# Patient Record
Sex: Male | Born: 1950 | Race: White | Hispanic: Refuse to answer
Health system: Western US, Academic
[De-identification: ages and names within clinical notes are randomized; demographics above are authoritative.]

## PROBLEM LIST (undated history)

## (undated) DIAGNOSIS — Z87438 Personal history of other diseases of male genital organs: Secondary | ICD-10-CM

## (undated) DIAGNOSIS — E119 Type 2 diabetes mellitus without complications: Secondary | ICD-10-CM

## (undated) DIAGNOSIS — N400 Enlarged prostate without lower urinary tract symptoms: Secondary | ICD-10-CM

## (undated) DIAGNOSIS — I10 Essential (primary) hypertension: Secondary | ICD-10-CM

## (undated) DIAGNOSIS — K922 Gastrointestinal hemorrhage, unspecified: Secondary | ICD-10-CM

## (undated) DIAGNOSIS — K5790 Diverticulosis of intestine, part unspecified, without perforation or abscess without bleeding: Secondary | ICD-10-CM

## (undated) HISTORY — PX: SURGICAL HX OTHER: 99

## (undated) HISTORY — DX: Personal history of other diseases of male genital organs: Z87.438

## (undated) HISTORY — DX: Diverticulosis of intestine, part unspecified, without perforation or abscess without bleeding: K57.90

## (undated) HISTORY — DX: Gastrointestinal hemorrhage, unspecified: K92.2

## (undated) HISTORY — DX: Type 2 diabetes mellitus without complications: E11.9

## (undated) HISTORY — DX: Benign prostatic hyperplasia without lower urinary tract symptoms: N40.0

## (undated) HISTORY — DX: Essential (primary) hypertension: I10

## (undated) MED ORDER — GLIMEPIRIDE 2 MG OR TABS
ORAL_TABLET | ORAL | 0 refills | Status: AC
Start: 2017-03-24 — End: ?

## (undated) MED ORDER — BENAZEPRIL HCL 5 MG OR TABS
ORAL_TABLET | ORAL | Status: AC
Start: 2016-03-13 — End: ?

## (undated) MED ORDER — GLIMEPIRIDE 2 MG OR TABS
ORAL_TABLET | ORAL | Status: AC
Start: 2014-11-06 — End: ?

## (undated) MED ORDER — METFORMIN HCL 850 MG OR TABS
ORAL_TABLET | ORAL | 0 refills | Status: AC
Start: 2019-11-19 — End: ?

---

## 2013-06-23 ENCOUNTER — Telehealth (INDEPENDENT_AMBULATORY_CARE_PROVIDER_SITE_OTHER): Payer: Self-pay

## 2013-06-23 DIAGNOSIS — N400 Enlarged prostate without lower urinary tract symptoms: Secondary | ICD-10-CM

## 2013-06-23 NOTE — Telephone Encounter (Signed)
Dayna--Referral done.  Tell him Dr Remonia Richter.  In your folder.

## 2013-06-23 NOTE — Telephone Encounter (Signed)
Pt has a New Pt appt with you on 7/27 (first available) but he was in [facility] and then back again a week later for a GI bleed and a lot of back/lwr quadrant pain at the end of May.  He really would like to get in sooner with you to review the cat scan that was done and to see what he needs to do from here.  His phone is [phone].  Please advise.  Thanks, md

## 2013-06-23 NOTE — Telephone Encounter (Signed)
I spoke with the patient and informed him I will be going out of town for several weeks.   He specifically tells me the nature of the problem.  He has an x-ray that states there is a prostate problem on the CT scan he has already been seen by a gastroenterologist in relationship to acute diverticulitis and a GI bleed.  He states as far as he is concerned those problems are currently stable he is fine was seeing me when I return which will be the week of  July 7.  I told him I would be happy to refer him to a urologist re: the prostate.   He will check with his insurance to see if doctors Samule OhmDowney  and/or Roddy are participants.   If not he will research and get me some names of urologists who do participate with this plan.

## 2013-06-23 NOTE — Telephone Encounter (Signed)
[facility] [phone] ok to leave msg.  Request referral to urologist, whichever Dr Lorenso Courier chooses, these are in his plan.   Dr Raquel James, MD  Dr Fabian Sharp, MD [facility]    [facility]  [phone] [address] [place], [place] [address]  Phone [phone]  Fax [phone]

## 2013-06-23 NOTE — Telephone Encounter (Signed)
New pt needs sooner appt

## 2013-06-23 NOTE — Telephone Encounter (Signed)
Faxed referral mailed a copy to pt.

## 2013-06-23 NOTE — Telephone Encounter (Signed)
Referral to urology

## 2013-06-29 ENCOUNTER — Telehealth (INDEPENDENT_AMBULATORY_CARE_PROVIDER_SITE_OTHER): Payer: Self-pay

## 2013-06-29 DIAGNOSIS — N429 Disorder of prostate, unspecified: Secondary | ICD-10-CM

## 2013-06-29 NOTE — Telephone Encounter (Signed)
CONFIRMED PHONE NUMBER: [phone]  CALLERS FIRST AND LAST NAME: Floyde ParkinsAndrew W Catapano  FACILITY NAME: n/a TITLE: n/a  CALLERS RELATIONSHIP:Self  RETURN CALL: Detailed message on voicemail only     SUBJECT: Referral Request Referral  PCP: Hagedorn, Debe CoderJoel Steven  [facility] Provider who should or did order the referral. Dr. Lorenso CourierHagedorn  Reason for the call: Other Clinic does not accept his insurance  Have you previously requested a referral? YES  Referral to what specialty?  [facility]   Symptom(s) for the referral: Enlarged prostate  Name of Doctor and/or Clinic:  Dr. Emeline [facility]lingGore / [facility] - patient is scheduled at 07/05/13 at 10:30  Specialty Clinic Phone number: [phone]  Specialty Clinic Fax number: [phone]  Have we already made referral? YES

## 2013-06-29 NOTE — Telephone Encounter (Signed)
He is saying they don't take his insurance

## 2013-06-29 NOTE — Telephone Encounter (Signed)
Referral for enlarged prostate

## 2013-06-29 NOTE — Telephone Encounter (Signed)
Please call the office of the urologist to verify that they take his insurance since he previously gave me the name of dr grier.

## 2013-06-29 NOTE — Telephone Encounter (Signed)
Dayna--we did a referral to dr Remonia RichterGrier.  I placed it in your folder to fax and let the patient know.  I think we did this already, but lets do this now.  Thanks

## 2013-06-30 NOTE — Telephone Encounter (Signed)
Referral completed

## 2013-06-30 NOTE — Telephone Encounter (Signed)
[phone]

## 2013-06-30 NOTE — Telephone Encounter (Signed)
Spoke with Dr. Ellyn HackGore's billing dept and she confirmed yes they do accept his insurance.

## 2013-06-30 NOTE — Telephone Encounter (Signed)
He spoke to both insurance and they said because [facility]. The doctor's office also contracted with his insurance.

## 2013-06-30 NOTE — Addendum Note (Signed)
Addended by: Perrin MalteseHAGEDORN, Hilman Kissling STEVEN on: 06/30/2013 12:36 PM     Modules accepted: Orders

## 2013-07-05 ENCOUNTER — Encounter (HOSPITAL_BASED_OUTPATIENT_CLINIC_OR_DEPARTMENT_OTHER): Payer: Self-pay | Admitting: Urology

## 2013-07-05 ENCOUNTER — Ambulatory Visit: Payer: 59 | Attending: Urology | Admitting: Urology

## 2013-07-05 VITALS — BP 110/71 | HR 85 | Ht 72.0 in | Wt 225.4 lb

## 2013-07-05 DIAGNOSIS — N529 Male erectile dysfunction, unspecified: Secondary | ICD-10-CM | POA: Insufficient documentation

## 2013-07-05 DIAGNOSIS — N401 Enlarged prostate with lower urinary tract symptoms: Secondary | ICD-10-CM | POA: Insufficient documentation

## 2013-07-05 DIAGNOSIS — R3129 Other microscopic hematuria: Secondary | ICD-10-CM | POA: Insufficient documentation

## 2013-07-05 DIAGNOSIS — N138 Other obstructive and reflux uropathy: Secondary | ICD-10-CM

## 2013-07-05 LAB — URINALYSIS COMPLETE, URN
Bacteria, URN: NONE SEEN
Bilirubin (Qual), URN: NEGATIVE
Epith Cells_Renal/Trans,URN: NEGATIVE /HPF
Epith Cells_Squamous, URN: NEGATIVE /LPF
Glucose Qual, URN: >1000 mg/dL — AB
Ketones, URN: NEGATIVE mg/dL
Leukocyte Esterase, URN: NEGATIVE
Nitrite, URN: NEGATIVE
Occult Blood, URN: NEGATIVE
Protein (Alb Semiquant), URN: NEGATIVE mg/dL
RBC, URN: NEGATIVE /HPF
Specific Gravity, URN: 1.025 g/mL (ref 1.002–1.027)
WBC, URN: NEGATIVE /HPF

## 2013-07-05 LAB — PR MEAS POST-VOIDING RESIDUAL URINE&/BLADDER CAP: Volume, URN: 70

## 2013-07-05 MED ORDER — TAMSULOSIN HCL 0.4 MG OR CAPS
0.4000 mg | ORAL_CAPSULE | Freq: Every evening | ORAL | Status: DC
Start: 2013-07-05 — End: 2013-07-20

## 2013-07-05 MED ORDER — SILDENAFIL CITRATE 20 MG OR TABS
20.0000 mg | ORAL_TABLET | Freq: Every day | ORAL | Status: DC | PRN
Start: 2013-07-05 — End: 2013-07-20

## 2013-07-05 NOTE — Progress Notes (Signed)
UROLOGY OUTPATIENT CONSULTATION:       CHIEF COMPLAINT/REASON FOR REFERRAL  Enlarged Prostate    HISTORY OF PRESENT ILLNESS  William Bryant is a 63 year old male with a past medical history of diverticulosis and type 2 diabetes who presents today for evaluation of an enlarged prostate. Earlier this month he was admitted to an outside hospital for a lower GI bleed and management of diverticulosis. After discharge, William Bryant began experiencing significant lower back pain and returned to the hospital for further evaluation, at which time a CT was obtained. The report mentioned an enlarged prostate. William Bryant also explains that he has experienced lower urinary tract symptoms over the past two years. Upon questioning the patient about his urinary symptoms, he endorses: no nocturia, no perceived increased urinary frequency, no urinary urgency, no urinary urgency with a fear of leaking, incomplete emptying, weak stream, post-void dribbling, no intermittency, no hesitancy, no split urinary stream, and occasional need for abdominal strain.    He reports no associated incontinence. He has not tried prior treatments for these symptoms. I queried William Bryant about non-traditional therapies for his symptoms and he states that he has not tried non-traditional medications. He reports social alcohol consumption including 1-2 drinks per week, history of tobacco use numbering 20 pack years, having quit in 2000 and regular caffeine consumption although he has switched to tea more recently. He has no history of urinary tract infections, no dysuria, no history of sexually transmitted infections, no hematuria, no comorbid neurologic conditions, and no associated pain symptoms. He has no other notable other genitourinary history. He has no history of bladder or urethral trauma. He has family history of urinary symptoms including positive history among first-degree relatives (his father had prostate problems although he  is unsure whether this was cancer or an enlarged prostate). William Bryant has had multiple DREs in the past which he reports were normal. His most recent PSA obtained during his recent hospitalization (June 14, 2013) was 1.92. He has never had a prostate biopsy. He is currently interested in medical management and would like to avoid surgical management. William Bryant also reports recent difficulty with erections that began within the past year. He is able to achieve satisfactory erections ~90% of the time, however he has difficulty maintaining them during sexual activity. He is concerned this could be related to his prostate as well as his diabetes.     Past Medical Hx:    Diverticulosis  DM Type 2    Past Surgical Hx:    Knee replacement X 2    Family and Social Hx:    Father: colon cancer  Mother: Heart disesase  Sister: Breast cancer    Active Meds:    Metformin 1000 BID  Simvastatin 20mg   Benzepril 5mg   Aspirin 81mg     Allergies:    None    Review of Systems:   A 14-system review of systems was filled out by the patient and reviewed by me and can be linked to in the chart dated today.    OBJECTIVE:  Physical Exam  Vitals:    Filed Vitals:    07/05/13 1026 07/05/13 1040   BP:  110/71   Pulse:  85   Height: 6' (1.829 m)    Weight: 225 lb 6.4 oz (102.241 kg)    SpO2:  96%   General: William Bryant sappears comfortable and in no acute distress.  HEENT:  WNL  Lymphatic:  No neck adenopathy, no groin adenopathy,  no axilla adenopathy.  Respiratory:  Normal effort, bilateral symmetric chest expansion, no wheezes.  Cardiovascular:  Palpable pulses in the upper extremities that are regular.  Abdomen: No abdominal masses or tenderness, no hepatosplenomegaly  Back: No CVA tenderness and no flank mass.  Genitourinary: Uncircumcised penis with no lesions noticed. The penis is not buried. Orthotopic meatus with no stenosis. No scrotal wall skin changes. Testes descended bilaterally, no scrotal rash or lesions noticed,  epidimymis normal to palpation, no hernia.    Rectal: Perineum normal to visual inspection, no erythema or irritation, sphincter with good tone, prostate smooth, symmetric, ++ enlargement with no nodule.   Extremities:  No clubbing or cyanosis.  Neuro/Psych:  Alert and Oriented x3, affect appropriate.    Labs, medical images, tracings, or specimens reviewed today with the patient include:  CT (06/14/13): "Enlarged prostate with possible TURP defect. Slightly thickwalled bladder presumably hypertrophy"  PSA (06/14/13): 1.92    Other Procedures:  To evaluate Marda Stalkerndrew W Chachere's complaint of the symptom of weak urinary stream, we performed post-void residual assessment by bladder scan.   Post void Residual: 70 cc   Impression:  Mild to moderate incomplete emptying.     ASSESSMENT:   William Bryant is a 63 year old male who presents with lower urinary tract symptoms and recent imaging suggestive of BPH. He is interested in Bryant analystmedical management.    PLAN:  1. BPH  Mr. Linford ArnoldRabinovich's age, LUTS, and recent imaging are suggestive of BPH. An emptying study was performed in the office which showed a post void residual of 70 mL. A urinalysis was also performed to evaluate for hematuria which William Bryant reported having during his hospitalization. Given his interest in medical management as well as the impact of his symptoms on his quality of life, I discussed starting him on Flomax (0.4mg ). I explained that this will help relax the urinary outflow tract as it goes through the prostate, thus making it easier to urinate. We also discussed the importance of avoiding bladder irritants such as coffee, tea, and soda.   2. Erectile Dysfunction   William Bryant reports a relatively sudden onset of erectile dysfunction over the past year. Though he does not have difficulty achieving erections, he does have difficulty maintaining them throughout sexual activity. We had a discussion regarding the possible cause of erectile dysfunction,  including psychological and organic. I went over two options, including Viagra and Cialis, including their benefits and side effects. I am writing him a prescription for the 20 mg dose of sildenafil.     William Bryant will return to the clinic in 4 months to reevaluate his lower urinary tract symptoms as well as his erectile dysfunction.

## 2013-07-19 ENCOUNTER — Encounter (INDEPENDENT_AMBULATORY_CARE_PROVIDER_SITE_OTHER): Payer: Self-pay

## 2013-07-20 ENCOUNTER — Encounter (INDEPENDENT_AMBULATORY_CARE_PROVIDER_SITE_OTHER): Payer: Self-pay

## 2013-07-20 ENCOUNTER — Ambulatory Visit (INDEPENDENT_AMBULATORY_CARE_PROVIDER_SITE_OTHER): Payer: 59

## 2013-07-20 VITALS — BP 120/60 | HR 86 | Ht 70.0 in | Wt 222.0 lb

## 2013-07-20 DIAGNOSIS — E782 Mixed hyperlipidemia: Secondary | ICD-10-CM

## 2013-07-20 DIAGNOSIS — K5731 Diverticulosis of large intestine without perforation or abscess with bleeding: Secondary | ICD-10-CM

## 2013-07-20 DIAGNOSIS — E119 Type 2 diabetes mellitus without complications: Secondary | ICD-10-CM

## 2013-07-20 DIAGNOSIS — N138 Other obstructive and reflux uropathy: Secondary | ICD-10-CM | POA: Insufficient documentation

## 2013-07-20 DIAGNOSIS — N401 Enlarged prostate with lower urinary tract symptoms: Secondary | ICD-10-CM | POA: Insufficient documentation

## 2013-07-20 DIAGNOSIS — K922 Gastrointestinal hemorrhage, unspecified: Secondary | ICD-10-CM | POA: Insufficient documentation

## 2013-07-20 DIAGNOSIS — N400 Enlarged prostate without lower urinary tract symptoms: Secondary | ICD-10-CM

## 2013-07-20 DIAGNOSIS — N529 Male erectile dysfunction, unspecified: Secondary | ICD-10-CM | POA: Insufficient documentation

## 2013-07-20 NOTE — Patient Instructions (Addendum)
Seek immediate attention if symptoms are worsening.    Call if symptoms are not resolving with therapy.    Please schedule a general wellness exam for September.

## 2013-07-20 NOTE — Progress Notes (Signed)
DATE: 07/20/2013     PHYSICIAN NOTE:  Patient Name: William Bryant  Medical Record Number: [id number]  Visit Date/time: 07/20/2013     Date of Birth: [birth year]    CHIEF COMPLAINT:    William Bryant is a 63 year old male who is here today for review of multiple medical problems, and who is here to establish care.     PROBLEMS TO BE ADDRESSED ON TODAYS VISIT:  (250.00) Type 2 diabetes mellitus without complication  (primary encounter diagnosis)  (578.9) Lower GI bleed  (562.12) Diverticulosis of large intestine with hemorrhage  (272.2) Mixed hyperlipidemia  (600.00) BPH (benign prostatic hyperplasia)  (841.32(607.84) Erectile dysfunction, unspecified erectile dysfunction type    HPI:  History provided by patient.    William Bryant is a 63 year old male who is here today for review of multiple medical problems, and who is here to establish care. The patient was recently admitted to a hospital on 06/03/13 for a lower GI bleed secondary to diverticulosis.     He has a h/o diabetes mellitus type II, and was diagnosed with this about two years ago. The patient is currently on metformin 1,000 mg bid.     The patient was evaluated by Dr. Emeline DarlingGore of urology on 07/05/13, where he was diagnosed with BPH and erectile dysfunction. Flomax and Cialis were issued at the time, however the patient has not begun these medication as he is not certain if he wants to take these medications. He was seen for microhematuria as well, however his second UA resulted in no microhematuria. A PSA was performed on 06/14/13, and was WNL at 1.92      The patient states that he does not need any lab work performed today, as he is not due for a general wellness exam until September of this year. He just had blood work performed by his prior PCP in March, as well as extensive labs just done during his recent hospitalization.and will obtain these records so that I can review them.     His mother passed away at age 10563 due to uncertain cause. His father passed  away at age 61, and the patient believes that he had colon cancer. Sister had breast cancer.     Last ophthalmology visit was a couple of months ago. He just had a colonoscopy, and was cleared for five years. TDAP is UTD.      PROBLEM LIST:  Patient Active Problem List   Diagnosis    Type 2 diabetes mellitus without complication    Lower GI bleed    Diverticulosis of large intestine with hemorrhage    Mixed hyperlipidemia    BPH (benign prostatic hyperplasia)    Erectile dysfunction       SURGICAL HISTORY:  Past Surgical History   Procedure Laterality Date    Knee replacement x2         MEDICATIONS:  Current Outpatient Prescriptions   Medication Sig Dispense Refill    Aspirin 81 MG Oral Chew Tab Take 81 mg by mouth every day.      Benazepril HCl 5 MG Oral Tab Take 1 Tab by mouth daily at bedtime. To prevent kidney damage.      Cholecalciferol (D 2000) 2000 UNITS Oral Tab Take 1 Tab by mouth every day.      MetFORMIN HCl (GLUCOPHAGE) 1000 MG Oral Tab Take 1 Tab by mouth twice a day. With largest meals      Simvastatin 20 MG Oral  Tab Take 1 Tab by mouth every day.       No current facility-administered medications for this visit.       ALLERGIES:  Review of patient's allergies indicates:  No Known Allergies    FAMILY HISTORY:  Family History   Problem Relation Age of Onset    Cancer Father      patient believes colon cancer    Breast Cancer Sister     Deep Vein Thrombosis Father        SOCIAL HISTORY:  History     Social History    Marital Status: N/A     Spouse Name: N/A     Number of Children: N/A    Years of Education: N/A     Occupational History    Not on file.     Social History Main Topics    Smoking status: Former Smoker -- 22 years     Types: Cigarettes     Quit date: 02/13/1998    Smokeless tobacco: Never Used    Alcohol Use: 1.0 oz/week     1 Glasses of wine, 1 Cans of beer per week    Drug Use: No    Sexual Activity: Not on file     Other Topics Concern    Not on file     Social  History Narrative    Married with one son. Medical and legal interpreter, self employed. Rare ETOH. Former smoker. No social drug use. Rare exercise.          REVIEW OF SYSTEMS:     male    CONSTITUTIONAL: Denies, fatigue, unexpected weight loss, unexpected weight gain  NEUROLOGIC: Denies, headaches and tremors  OPHTHALMIC: Denies, blurry vision, diplopia, eye pain  ENT: Denies, hearing difficulties, nasal congestion and any ear nose or throat problems  RESPIRATORY: Denies, dyspnea on exertion, dyspnea at rest, cough, wheezing  CARDIOVASCULAR: Denies, chest pain or pressure at rest or during exercise, palpitations  GI: H/o diverticulosis of large intestine with hemorrhage, lower GI bleed   GENITOURINARY: H/o BPH, Erectile dysfunction   INTEGUMENTARY: Denies, skin rash, easy bruising, hair loss, changes in moles or new moles  RHEUMATOLOGIC/MSK: Denies, joint swelling, joint stiffness, any joint or arthritic problems  ENDOCRINE: H/o mixed hyperlipidemia, diabetes mellitus    HEME/LYMPH:  Denies, Easy bruising, Lumps under arms or around neck and A  PSYCHOSOCIAL: Denies and any psychological problems  HIV RISK: Denies, multiple sexual partners, history of a sexually transmitted disease, transfusion between 1978 and 1985 and use of illicit drugs by injection    PHYSICAL EXAM:  BP 120/60   Pulse 86   Ht 5\' 10"  (1.778 m)   Wt 222 lb (100.699 kg)   BMI 31.85 kg/m2    Physical Examination--male: Vital signs as noted above.    General Appearance and Mental Status: Well Developed/Well Nourished  [ ]  Slender  [ ]  Average  [ ]  Heavy [x ] Obese  [ x] Appropriate Affect, Oriented to Person, Place, Time    Skin: Warm, dry to touch    Head: Normocephalic, atraumatic    Eyes: EOMI, PERLA    Ears: TMs clear bilaterally    Nose: Septum intact, no lesions, pink mucosa and turbinates, no discharge    Pharynx: Clear    Neck: Supple, without JVD  or goiter    Chest: Clear     CV: Heart: RRR, no murmur, no rub, no S3, S4 noted    Abdomen:  Soft, bowel sounds present,  without masses or tenderness noted, no hepatosplenomegaly noted    Rectal:  Deferred     Extrem: no edema    MSK:   normal gait and station, no clubbing, no cyanosis;  upper and lower extremities                show no defects in symmetry, crepitance, tenderness,  masses, or effusion              normal range of motion without dislocation, subluxation or laxity; normal muscle               tone and strength.    Neuro: intact       ASSESSMENT AND PLAN:  Pertinent Labs & Imaging studies reviewed. (See chart for details)  Prior EMR records reviewed in EPIC as available and clinically relevant.    1. Type 2 diabetes mellitus without complication  He is due for blood work in September and will schedule an appointment accordingly     2. Lower GI bleed  stable    3. Diverticulosis of large intestine with hemorrhage  stable    4. Mixed hyperlipidemia  He is due for blood work in September and will schedule an appointment accordingly     5. BPH (benign prostatic hyperplasia)  He is due for blood work in September and will schedule an appointment accordingly     6. Erectile dysfunction, unspecified erectile dysfunction type  Stable    He was issued both Flomax and Cialis by the urologist, however he has not begun these medications as he is uncertain if he wants to take them or not.       Seek immediate attention if symptoms are worsening.    Call if symptoms are not resolving with therapy.      HEALTH MAINTENANCE:    Colonoscopy performed in June 2015 with Dr. Jaynie Collins, cleared for five years         Ophthalmology visit- within the year of 2015        TDAP- 11/2012           Portions of this chart were written by Erroll Luna, Medical Scribe with oversight by Katy Fitch, MD 07/20/2013 3:51 PM     I, Katy Fitch, have reviewed the information recorded by the scribe for accuracy and agree with its contents.      This medical record has been electronically generated with voice recognition software.  Every attempt is made to assure accuracy.  However, it may contain errors related to this process.

## 2013-07-20 NOTE — Progress Notes (Signed)
I, Katy FitchJoel Keegan Ducey, MD interviewed and examined the patient while overseeing the documentation performed by my Medical Scribe, Erroll LunaSarah McHugh. I have reviewed and revised as necessary the scribe's note and agree with the documented findings and plan of care. Please refer to the scribe's note below for further detailed information regarding the patient encounter and exam.  07/20/2013. 8:11 PM.

## 2013-07-27 ENCOUNTER — Other Ambulatory Visit (INDEPENDENT_AMBULATORY_CARE_PROVIDER_SITE_OTHER): Payer: Self-pay

## 2013-07-27 DIAGNOSIS — E782 Mixed hyperlipidemia: Secondary | ICD-10-CM

## 2013-07-27 NOTE — Telephone Encounter (Signed)
Pharmacy isn't listed patient request [facility] [place]

## 2013-07-28 MED ORDER — SIMVASTATIN 20 MG OR TABS
20.0000 mg | ORAL_TABLET | Freq: Every day | ORAL | Status: DC
Start: 2013-07-28 — End: 2013-12-02

## 2013-07-28 NOTE — Telephone Encounter (Signed)
Refill Request    Last visit with PCP: 07/20/13  Next visit: 09/21/13  Labs: no lipids in EPIC    Last refill: not given by you in EPIC  Letter Sent: NO  Diagnosis:?

## 2013-08-04 ENCOUNTER — Encounter (INDEPENDENT_AMBULATORY_CARE_PROVIDER_SITE_OTHER): Payer: Self-pay

## 2013-09-20 ENCOUNTER — Encounter (INDEPENDENT_AMBULATORY_CARE_PROVIDER_SITE_OTHER): Payer: Self-pay

## 2013-09-21 ENCOUNTER — Ambulatory Visit (INDEPENDENT_AMBULATORY_CARE_PROVIDER_SITE_OTHER): Payer: 59

## 2013-09-21 ENCOUNTER — Encounter (INDEPENDENT_AMBULATORY_CARE_PROVIDER_SITE_OTHER): Payer: Self-pay

## 2013-09-21 VITALS — BP 114/80 | HR 76 | Ht 70.5 in | Wt 221.0 lb

## 2013-09-21 DIAGNOSIS — Z0000 Encounter for general adult medical examination without abnormal findings: Secondary | ICD-10-CM

## 2013-09-21 DIAGNOSIS — E119 Type 2 diabetes mellitus without complications: Secondary | ICD-10-CM

## 2013-09-21 DIAGNOSIS — E782 Mixed hyperlipidemia: Secondary | ICD-10-CM

## 2013-09-21 DIAGNOSIS — N529 Male erectile dysfunction, unspecified: Secondary | ICD-10-CM

## 2013-09-21 DIAGNOSIS — K5731 Diverticulosis of large intestine without perforation or abscess with bleeding: Secondary | ICD-10-CM

## 2013-09-21 DIAGNOSIS — Z8249 Family history of ischemic heart disease and other diseases of the circulatory system: Secondary | ICD-10-CM

## 2013-09-21 DIAGNOSIS — N400 Enlarged prostate without lower urinary tract symptoms: Secondary | ICD-10-CM

## 2013-09-21 LAB — CBC, DIFF
% Basophils: 0 %
% Eosinophils: 1 %
% Immature Granulocytes: 0 %
% Lymphocytes: 22 %
% Monocytes: 8 %
% Neutrophils: 69 %
Absolute Eosinophil Count: 0.06 10*3/uL (ref 0.00–0.50)
Absolute Lymphocyte Count: 1.29 10*3/uL (ref 1.00–4.80)
Basophils: 0.02 10*3/uL (ref 0.00–0.20)
Hematocrit: 50 % (ref 38–50)
Hemoglobin: 16.6 g/dL (ref 13.0–18.0)
Immature Granulocytes: 0 10*3/uL (ref 0.00–0.05)
MCH: 28.6 pg (ref 27.3–33.6)
MCHC: 33.3 g/dL (ref 32.2–36.5)
MCV: 86 fL (ref 81–98)
Monocytes: 0.47 10*3/uL (ref 0.00–0.80)
Neutrophils: 3.98 10*3/uL (ref 1.80–7.00)
Platelet Count: 271 10*3/uL (ref 150–400)
RBC: 5.81 mil/uL — ABNORMAL HIGH (ref 4.40–5.60)
RDW-CV: 13.9 % (ref 11.6–14.4)
WBC: 5.82 10*3/uL (ref 4.3–10.0)

## 2013-09-21 LAB — COMPREHENSIVE METABOLIC PANEL
ALT (GPT): 26 U/L (ref 10–48)
AST (GOT): 17 U/L (ref 9–38)
Albumin: 4.8 g/dL (ref 3.5–5.2)
Alkaline Phosphatase (Total): 67 U/L (ref 37–159)
Anion Gap: 9 (ref 4–12)
Bilirubin (Total): 1.5 mg/dL — ABNORMAL HIGH (ref 0.2–1.3)
Calcium: 9.3 mg/dL (ref 8.9–10.2)
Carbon Dioxide, Total: 27 mEq/L (ref 22–32)
Chloride: 101 mEq/L (ref 98–108)
Creatinine: 0.85 mg/dL (ref 0.51–1.18)
GFR, Calc, African American: >60 mL/min (ref >59)
GFR, Calc, European American: >60 mL/min (ref >59)
Glucose: 138 mg/dL — ABNORMAL HIGH (ref 62–125)
Potassium: 4.5 mEq/L (ref 3.6–5.2)
Protein (Total): 7.2 g/dL (ref 6.0–8.2)
Sodium: 137 mEq/L (ref 135–145)
Urea Nitrogen: 18 mg/dL (ref 8–21)

## 2013-09-21 LAB — LIPID PANEL
Cholesterol (LDL): 75 mg/dL (ref <130)
Cholesterol/HDL Ratio: 3
HDL Cholesterol: 50 mg/dL (ref >39)
Non-HDL Cholesterol: 102 mg/dL (ref 0–159)
Total Cholesterol: 152 mg/dL (ref <200)
Triglyceride: 137 mg/dL (ref <150)

## 2013-09-21 LAB — CK, CREATINE KINASE, TOTAL ACTIVITY: Creatine Kinase Total Activity: 64 U/L (ref 62–325)

## 2013-09-21 LAB — THYROID STIMULATING HORMONE: Thyroid Stimulating Hormone: 0.635 u[IU]/mL (ref 0.400–5.000)

## 2013-09-21 NOTE — Progress Notes (Signed)
I, Katy Fitch, MD interviewed and examined the patient while overseeing the documentation performed by my Medical Scribe, Erroll Luna. I have reviewed and revised as necessary the scribe's note and agree with the documented findings and plan of care. Please refer to the scribe's note below for further detailed information regarding the patient encounter and exam.  09/21/2013. 10:51 AM.

## 2013-09-21 NOTE — Patient Instructions (Addendum)
Seek immediate attention if symptoms are worsening.    Call if symptoms are not resolving with therapy.    A letter will be coming within the next two weeks reviewing the lab results.      Electrocardiogram (ECG)  The ECG (also known as EKG) is a test that records electrical signals from your heart onto a paper strip. The pattern of these signals can help tell the doctor whether your heart is normal, under stress, or experiencing electrical problems, strain, or damage.    Getting Ready  · Wear loose, comfortable clothing that allows easy access to the chest.  · Avoid using skin lotion, because this may make it difficult for the pads to stick.   · Allow enough time before your ECG to check in. You will likely need to fill out paperwork before the test.  What Happens During an ECG  · You will be asked to remove your clothing from the waist up and to put on a gown. You will then lie down on your back.  · Electrodes (small pads) are placed on your chest, shoulders, and legs. If you have excess chest hair that keeps the electrodes from making contact, small patches of hair may need to be shaved.   · The electrodes record your heart rhythm and any change in your heart’s signals that occurs during the test.  · After a few minutes of recording, the health care provider will remove the electrodes. The ECG takes about 10 minutes.  After the Test  · You can resume your normal activity.  · The results are sent to your doctor.  · Be sure to keep your follow-up appointment.  Your next appointment is:____________________  Tell Your Health Care Provider  Talk to your health care provider if you:  · Are taking any medications  · Feel any chest discomfort  · Feel as though your heart is beating rapidly or irregularly (palpitations)  · Have ever blacked out   © 2000-2015 [employer] [address], [place] [address]. All rights reserved. This information is not intended as a substitute for professional medical  care. Always follow your healthcare professional's instructions.

## 2013-09-21 NOTE — Progress Notes (Signed)
12 lead EKG performed on pt.

## 2013-09-21 NOTE — Progress Notes (Signed)
DATE: 09/21/2013     PHYSICIAN NOTE:  Patient Name: William Bryant  Medical Record Number: [id number]  Visit Date/time: 09/21/2013     Date of Birth: 1950-02-22    CHIEF COMPLAINT:     William Bryant is a 63 year old male who is here today for review of multiple medical problems, and for a wellness exam.    PROBLEMS TO BE ADDRESSED ON TODAYS VISIT:  (V70.0) Routine general medical examination at a health care facility  (primary encounter diagnosis)  (250.00) Type 2 diabetes mellitus without complication  (272.2) Mixed hyperlipidemia  (607.84) Erectile dysfunction, unspecified erectile dysfunction type  (562.12) H/O--Diverticulosis of large intestine with hemorrhage  (600.00) BPH (benign prostatic hyperplasia)  (V17.3) Family history of early CAD    HPI:  History provided by patient.    William Bryant is a 63 year old male who is here today for review of multiple medical problems, and for a wellness exam. The patient was evaluated by Dr. Emeline Darling recently, who performed a prostate examination and checked the PSA. Denies chest pain or palpitations.     Last ophthalmology visit was within the year of 2015. Colonoscopy performed in June 2015 with Dr. Jaynie Collins, cleared for five years. TDAP is UTD.      PROBLEM LIST:  Patient Active Problem List   Diagnosis    Type 2 diabetes mellitus without complication    Lower GI bleed    Diverticulosis of large intestine with hemorrhage    Mixed hyperlipidemia    BPH (benign prostatic hyperplasia)    Erectile dysfunction    Family history of early CAD--mother 31       SURGICAL HISTORY:  Past Surgical History   Procedure Laterality Date    Knee replacement x2         MEDICATIONS:  Current Outpatient Prescriptions   Medication Sig Dispense Refill    Aspirin 81 MG Oral Chew Tab Take 81 mg by mouth every day.      Benazepril HCl 5 MG Oral Tab Take 1 Tab by mouth daily at bedtime. To prevent kidney damage.      Cholecalciferol (D 2000) 2000 UNITS Oral Tab Take 1 Tab by mouth  every day.      MetFORMIN HCl (GLUCOPHAGE) 1000 MG Oral Tab Take 1 Tab by mouth twice a day. With largest meals      Simvastatin 20 MG Oral Tab Take 1 tablet (20 mg) by mouth daily. Take 1 Tab by mouth every day. 30 tablet 3     No current facility-administered medications for this visit.       ALLERGIES:  Review of patient's allergies indicates:  No Known Allergies    FAMILY HISTORY:  Family History   Problem Relation Age of Onset    Cancer Father      patient believes colon cancer    Breast Cancer Sister     Deep Vein Thrombosis Father     Myocardial Infarction       grandfather        SOCIAL HISTORY:  History     Social History    Marital Status: N/A     Spouse Name: N/A     Number of Children: N/A    Years of Education: N/A     Occupational History    Not on file.     Social History Main Topics    Smoking status: Former Smoker -- 22 years     Types: Cigarettes  Quit date: 02/13/1998    Smokeless tobacco: Never Used    Alcohol Use: 1.0 oz/week     1 Glasses of wine, 1 Cans of beer per week    Drug Use: No    Sexual Activity: Not on file     Other Topics Concern    Not on file     Social History Narrative    Married with one son. Medical and legal interpreter, self employed. Rare ETOH. Former smoker. No social drug use. Rare exercise.          REVIEW OF SYSTEMS:     Male    CONSTITUTIONAL: Denies, fatigue, unexpected weight loss, unexpected weight gain  NEUROLOGIC: Denies, headaches and tremors  OPHTHALMIC: Denies, blurry vision, diplopia, eye pain  ENT: Denies, hearing difficulties, nasal congestion and any ear nose or throat problems  RESPIRATORY: Denies, dyspnea on exertion, dyspnea at rest, cough, wheezing  CARDIOVASCULAR: Denies, chest pain or pressure at rest or during exercise, palpitations  GI: H/o diverticulosis of large intestine with hemorrhage   GENITOURINARY: H/o ED, BPH  INTEGUMENTARY: Denies, skin rash, easy bruising, hair loss, changes in moles or new moles  RHEUMATOLOGIC/MSK:  Denies, joint swelling, joint stiffness, any joint or arthritic problems  ENDOCRINE: H/o diabetes mellitus, hyperlipidemia   HEME/LYMPH:  Denies, Easy bruising, Lumps under arms or around neck and A  PSYCHOSOCIAL: Denies and any psychological problems  HIV RISK: Denies, multiple sexual partners, history of a sexually transmitted disease, transfusion between 1978 and 1985 and use of illicit drugs by injection    PHYSICAL EXAM:  BP 114/80 mmHg   Pulse 76   Ht 5' 10.5" (1.791 m)   Wt 221 lb (100.245 kg)   BMI 31.25 kg/m2    Physical Examination--Male: Vital signs as noted above.    General Appearance and Mental Status: Well Developed/Well Nourished   Slender   Average   Heavy [ x] Obese  [x ] Appropriate Affect, Oriented to Person, Place, Time    Skin: Warm, dry to touch    Head: Normocephalic, atraumatic    Eyes: EOMI, PERLA    Ears: TMs clear bilaterally    Nose: Septum intact, no lesions, pink mucosa and turbinates, no discharge    Pharynx: Clear    Neck: Supple, without JVD  or goiter    Chest: Clear     CV: Heart: RRR, no murmur, no rub, no S3, S4 noted    Abdomen: Soft, bowel sounds present, without masses or tenderness noted, no hepatosplenomegaly noted    Male GU:         Testes: nontender, no masses noted    Penis:  no lesions, no discharge    Prostate: size: deferred, Dr. Emeline Darling just examined the prostate     Rectal: deferred     Extrem: no edema    MSK:   normal gait and station, no clubbing, no cyanosis;  upper and lower extremities                show no defects in symmetry, crepitance, tenderness,  masses, or effusion              normal range of motion without dislocation, subluxation or laxity; normal muscle               tone and strength.    Neuro: intact     Diabetes Mellitus Foot exam:    - Visual inspection:  normal to inspection, with intact skin, no  significant callus formation, no evidence of ischemia   - Monofilament exam:  normal    - Pulse exam: normal     LAB RESULTS:  pending    ECG  RESULTS:    Done on 09/21/2013 at [facility]  Rate: 63 bpm, no significant abnormality      This ECG was interpreted by me contemporaneously. Obtaining the ECG had medical necessity in my clinical opinion.    ASSESSMENT AND PLAN:  Pertinent Labs & Imaging studies reviewed. (See chart for details)  Prior EMR records reviewed in EPIC as available and clinically relevant.    1. Routine general medical examination at a health care facility  Routine blood work to be performed today:   - CBC, DIFF  - COMPREHENSIVE METABOLIC PANEL  - CREATINE KINASE TOTAL ACTIVITY  - LIPID PANEL  - HEMOGLOBIN A1C, HPLC  - THYROID STIMULATING HORMONE  - ECG ROUTINE ECG W/LEAST 12 LDS W/I&R  - CREATININE CLEARANCE, URINE; Future  - CREATININE; Future  - TOTAL ALBUMIN, URINE; Future    2. Type 2 diabetes mellitus without complication  His feet are normal on examination with good pulses and normal sensation bilaterally. I will order a hemoglobin A1C to check the diabetes mellitus, and a 24 hour urine test to check the kidney function.   - HEMOGLOBIN A1C, HPLC  - FOOT EXAMINATION PERFORMED  - CREATININE CLEARANCE, URINE; Future  - CREATININE; Future  - TOTAL ALBUMIN, URINE; Future      3. Mixed hyperlipidemia  I will check his cholesterol profile today. CPK to be performed as well, as he is taking Simvastatin.   - COMPREHENSIVE METABOLIC PANEL  - CREATINE KINASE TOTAL ACTIVITY  - LIPID PANEL    4. Erectile dysfunction, unspecified erectile dysfunction type  stable    5. H/O--Diverticulosis of large intestine with hemorrhage      6. BPH (benign prostatic hyperplasia)  Stable- Dr. Emeline Darling checked a PSA recently     7. Family history of early coronary artery disease  He denies any chest pain or palpitations.   -ECG    Further treatment dependant on the results of the tests.    Seek immediate attention if symptoms are worsening.    Call if symptoms are not resolving with therapy.      HEALTH MAINTENANCE:        Colonoscopy performed in June 2015 with  Dr. Jaynie Collins, cleared for five years         Ophthalmology visit- within the year of 2015        TDAP- 11/2012           Portions of this chart were written by Erroll Luna, Medical Scribe with oversight by Katy Fitch, MD 09/21/2013 9:13 AM     I, Katy Fitch, have reviewed the information recorded by the scribe for accuracy and agree with its contents.      This medical record has been electronically generated with voice recognition software. Every attempt is made to assure accuracy.  However, it may contain errors related to this process.

## 2013-09-22 ENCOUNTER — Encounter (INDEPENDENT_AMBULATORY_CARE_PROVIDER_SITE_OTHER): Payer: Self-pay

## 2013-09-22 LAB — HEMOGLOBIN A1C, HPLC: Hemoglobin A1C: 7.5 % — ABNORMAL HIGH (ref 4.0–6.0)

## 2013-09-28 ENCOUNTER — Other Ambulatory Visit (INDEPENDENT_AMBULATORY_CARE_PROVIDER_SITE_OTHER): Payer: Self-pay

## 2013-09-28 ENCOUNTER — Telehealth (INDEPENDENT_AMBULATORY_CARE_PROVIDER_SITE_OTHER): Payer: Self-pay

## 2013-09-28 DIAGNOSIS — Z0000 Encounter for general adult medical examination without abnormal findings: Secondary | ICD-10-CM

## 2013-09-28 DIAGNOSIS — E119 Type 2 diabetes mellitus without complications: Secondary | ICD-10-CM

## 2013-09-28 LAB — ALBUMIN/CREATININE RATIO, TIMED URINE
Albumin (Micro) Interval, URN: 24.17 hr
Albumin (Micro), URN: 0.7 mg/dL
Albumin (Micro)/ 24 Hr, URN: 13 mg/24 hr (ref <30)
Albumin (Micro)/Min, URN: 9 mcg/min (ref <20)
Albumin (Micro)Total Volume, URN: 1834 mL
Albumin/Creatinine Ratio, URN: 5 mg/g Creat (ref <30)
Creatinine/Unit, URN: 136 mg/dL

## 2013-09-28 LAB — CREATININE CLEARANCE, URN
Creatinine Clearance Calc: 226 mL/min — ABNORMAL HIGH (ref 75–120)
Creatinine Interval, URN: 24.17 hr
Creatinine Total Volume, URN: 1834 mL
Creatinine/24H, Urine: 2477 mg/24 hr — ABNORMAL HIGH (ref 1000–2000)
Creatinine/Unit, Urine: 136 mg/dL

## 2013-09-28 LAB — CREATININE
Creatinine: 0.76 mg/dL (ref 0.51–1.18)
GFR, Calc, African American: >60 mL/min (ref >59)
GFR, Calc, European American: >60 mL/min (ref >59)

## 2013-09-28 MED ORDER — GLIMEPIRIDE 1 MG OR TABS
ORAL_TABLET | ORAL | Status: DC
Start: 2013-09-28 — End: 2014-04-23

## 2013-09-28 NOTE — Addendum Note (Signed)
Addended by: Consuello Bossier on: 09/28/2013 11:25 AM     Modules accepted: Orders

## 2013-09-28 NOTE — Telephone Encounter (Signed)
Please sign for A1c.

## 2013-09-28 NOTE — Addendum Note (Signed)
Addended by: Perrin Maltese on: 09/28/2013 11:31 AM     Modules accepted: Orders

## 2013-09-28 NOTE — Telephone Encounter (Signed)
Patient instructed to speak with Dayna regarding starting Glimepiride 1 mg.  Pharmacy: [facility] ([address])  [phone].

## 2013-09-28 NOTE — Telephone Encounter (Signed)
Done

## 2013-09-29 ENCOUNTER — Encounter (INDEPENDENT_AMBULATORY_CARE_PROVIDER_SITE_OTHER): Payer: Self-pay

## 2013-09-30 ENCOUNTER — Other Ambulatory Visit (INDEPENDENT_AMBULATORY_CARE_PROVIDER_SITE_OTHER): Payer: Self-pay

## 2013-09-30 DIAGNOSIS — E119 Type 2 diabetes mellitus without complications: Secondary | ICD-10-CM

## 2013-09-30 DIAGNOSIS — I10 Essential (primary) hypertension: Secondary | ICD-10-CM

## 2013-09-30 MED ORDER — BENAZEPRIL HCL 5 MG OR TABS
5.0000 mg | ORAL_TABLET | Freq: Every day | ORAL | Status: DC
Start: 2013-09-30 — End: 2013-10-28

## 2013-09-30 MED ORDER — METFORMIN HCL 1000 MG OR TABS
1000.0000 mg | ORAL_TABLET | Freq: Two times a day (BID) | ORAL | Status: DC
Start: 2013-09-30 — End: 2014-08-06

## 2013-09-30 NOTE — Telephone Encounter (Signed)
Refill Request    Last visit with PCP: 09/21/13 Physical   Next visit: none  Labs: ?    Last refill: Not given by dr. Lorenso Courier   Letter Sent: NO  Diagnosis: ?

## 2013-09-30 NOTE — Telephone Encounter (Signed)
Refill Request    Last visit with PCP: 09/21/13 physical   Next visit: none  Labs: 09/21/13 Hgb A1C Abnormal     Last refill: 09/17/12  Letter Sent: NO  Diagnosis: 250.00

## 2013-10-24 ENCOUNTER — Encounter (HOSPITAL_BASED_OUTPATIENT_CLINIC_OR_DEPARTMENT_OTHER): Payer: 59 | Admitting: Urology

## 2013-10-28 ENCOUNTER — Other Ambulatory Visit (INDEPENDENT_AMBULATORY_CARE_PROVIDER_SITE_OTHER): Payer: Self-pay

## 2013-10-28 DIAGNOSIS — I10 Essential (primary) hypertension: Secondary | ICD-10-CM

## 2013-10-28 MED ORDER — BENAZEPRIL HCL 5 MG OR TABS
5.0000 mg | ORAL_TABLET | Freq: Every day | ORAL | Status: DC
Start: 2013-10-28 — End: 2014-08-11

## 2013-10-28 NOTE — Telephone Encounter (Signed)
Refill Request    Last visit with PCP: physical: 09/21/2013  Next visit: none  Labs: cmp: 09/21/2013    Last refill: 09/30/2013  Letter Sent: NO  Diagnosis: essential hypertension, benign

## 2013-11-21 ENCOUNTER — Ambulatory Visit (INDEPENDENT_AMBULATORY_CARE_PROVIDER_SITE_OTHER): Payer: 59

## 2013-11-22 ENCOUNTER — Ambulatory Visit (INDEPENDENT_AMBULATORY_CARE_PROVIDER_SITE_OTHER): Payer: 59

## 2013-11-22 DIAGNOSIS — Z23 Encounter for immunization: Secondary | ICD-10-CM

## 2013-11-22 NOTE — Progress Notes (Signed)
VACCINE SCREENING/ORDER WHEN CONTRAINDICATIONS PRESENT    Order date: 11/22/2013  Ordering provider: Hagedorn  Clinic stock used: yes  Interpreter used?: None  Vaccine information sheet(s) discussed, patient/parent/guardian verbalized understanding? YES   Vaccines given: No orders of the defined types were placed in this encounter.     VIS given 11/22/2013 by Ballard Russellana L Buhrman, MA MA.        SCREENING QUESTIONS: INACTIVATED INFLUENZA VACCINE:  1. Do you have a high fever, severe cold-like symptoms or serious infection? NO   2. Do you have any food allergies such as eggs, chicken, chicken feathers, chicken dander, or gelatin? NO  3. Have you had a severe reaction to a vaccine in the past? NO  4. Have you had any severe reaction to a vaccine in the past such as a seizure, a convulsion from a high fever, or a paralysis problem called Guillain-Barre Syndrome? NO  5. QUESTIONS FOR WOMEN: Are you pregnant or planning on becoming pregnant in the next month? (If YES, you may give Fluarix or Fluzone Pre-Filled syringe rather than Multi-Dose Fluzone). NO    If the patient/parent/or guardian answered "yes" to any of the above questions, consult with the provider to review the responses prior to administering vaccination. Parents with a contraindication to immunization will not receive the immunization without a specific physician order to vaccinate the patient and discussion of risk and benefits related to the contraindication.     Physician Order for Administration of Vaccine(s) When Contraindications Are Present  I have reviewed the existence in this patient's health history of possible contraindication(s) to administration of vaccine. The benefits to this patient outweigh the potential risks of immunization. Administer vaccine(s): yes  Ballard RussellBuhrman, Dana L, 11/22/2013 2:29 PM MA.

## 2013-12-02 ENCOUNTER — Other Ambulatory Visit (INDEPENDENT_AMBULATORY_CARE_PROVIDER_SITE_OTHER): Payer: Self-pay

## 2013-12-02 DIAGNOSIS — E782 Mixed hyperlipidemia: Secondary | ICD-10-CM

## 2013-12-03 MED ORDER — SIMVASTATIN 20 MG OR TABS
ORAL_TABLET | ORAL | Status: DC
Start: 2013-12-03 — End: 2014-09-20

## 2013-12-03 NOTE — Telephone Encounter (Signed)
Refill Request    Last visit with PCP: 09/21/2013  Next visit: none  Labs: 09/21/2013 lipids and CMP    Last refill: 07/28/2013 #30 + 3  Letter Sent: NO    DX: Mixed hyperlipidemia

## 2014-01-02 ENCOUNTER — Other Ambulatory Visit (INDEPENDENT_AMBULATORY_CARE_PROVIDER_SITE_OTHER): Payer: Self-pay

## 2014-01-02 DIAGNOSIS — I10 Essential (primary) hypertension: Secondary | ICD-10-CM

## 2014-01-02 MED ORDER — BENAZEPRIL HCL 5 MG OR TABS
ORAL_TABLET | ORAL | Status: DC
Start: 2014-01-02 — End: 2014-02-17

## 2014-01-02 NOTE — Telephone Encounter (Signed)
Refill Request    Last visit with PCP: 09/21/13  Next visit: none  Labs: 09/28/13    Last refill: 10/28/13 # 30  Letter Sent: NO  Diagnosis: Essential hypertension, benign

## 2014-02-17 ENCOUNTER — Ambulatory Visit (INDEPENDENT_AMBULATORY_CARE_PROVIDER_SITE_OTHER): Payer: 59 | Admitting: Internal Medicine

## 2014-02-17 ENCOUNTER — Encounter (INDEPENDENT_AMBULATORY_CARE_PROVIDER_SITE_OTHER): Payer: Self-pay | Admitting: Internal Medicine

## 2014-02-17 VITALS — BP 126/70 | HR 73 | Temp 97.4°F | Ht 70.5 in | Wt 230.0 lb

## 2014-02-17 DIAGNOSIS — E119 Type 2 diabetes mellitus without complications: Secondary | ICD-10-CM

## 2014-02-17 DIAGNOSIS — J069 Acute upper respiratory infection, unspecified: Secondary | ICD-10-CM

## 2014-02-17 DIAGNOSIS — R05 Cough: Secondary | ICD-10-CM

## 2014-02-17 DIAGNOSIS — R059 Cough, unspecified: Secondary | ICD-10-CM | POA: Insufficient documentation

## 2014-02-17 DIAGNOSIS — M5412 Radiculopathy, cervical region: Secondary | ICD-10-CM

## 2014-02-17 DIAGNOSIS — R490 Dysphonia: Secondary | ICD-10-CM

## 2014-02-17 MED ORDER — GUAIFENESIN-CODEINE 100-10 MG/5ML OR SOLN
5.0000 mL | Freq: Four times a day (QID) | ORAL | Status: DC | PRN
Start: 2014-02-17 — End: 2014-12-18

## 2014-02-17 NOTE — Patient Instructions (Addendum)
Can you take plenty of liquids, pedialyte, saline nasal spray, steam, mucinex as an expectorant, and cheratussin cough medicine. Referral for PT as well. Referral to ENT- Dr Juel BurrowLin as well. Can you make a follow up apt with Dr Lorenso CourierHagedorn. Can you call Dr Lorenso CourierHagedorn or covering MD if problems.Thank you for seeing me today.Can you remember to arrive at least 15 minutes early for your next appointment.

## 2014-02-17 NOTE — Progress Notes (Signed)
Angeline SlimJohn Ishika Chesterfield, MD  Internal Medicine    PROGRESS NOTE         RE: William Bryant  MRN:  [id number]  DOB:  August 18, 1950      Date:  02/17/2014      I am seeing this patient with the following problems and last seen by Dr Lorenso CourierHagedorn 09/2013    PROBLEMS:  1. C/o getting 12/31 with stuffy nose , mucus and was hoarse and lasted 1 week and got sick again 1/30 with head congestion and cough 2/2 and worse next day and got better the next day.and yesterday felt better and today has a dry cough, no sore throat, runny nose which is clear.Nasal drainage is yellow.Has sinus congestion with post nasal drip.Was wheezing last night but less now.No earache.diking fluids, tea, vitamin C.  2. Diabetes.Stable.B.S. On meds and denies any problems.  3. Right arm numbness c/o numbness in right arm at night in the last 6 months and moves it and moves weight and improves. No weakness.No neck pain. No meds for this.Has pain in right scapula area too.    ALLERGIES - Review of patient's allergies indicates:  No Known Allergies     MEDICATIONS -   Current Outpatient Prescriptions   Medication Sig Dispense Refill   . Aspirin 81 MG Oral Chew Tab Take 81 mg by mouth every day.     . Benazepril HCl 5 MG Oral Tab Take 1 tablet (5 mg) by mouth daily. Take 1 Tab by mouth daily at bedtime. To prevent kidney damage. 30 tablet 0   . Cholecalciferol (D 2000) 2000 UNITS Oral Tab Take 1 Tab by mouth every day.     . Glimepiride 1 MG Oral Tab Take 1 tablet BID. One before breakfast and 1 before dinner. 60 tablet 2   . MetFORMIN HCl (GLUCOPHAGE) 1000 MG Oral Tab Take 1 tablet (1,000 mg) by mouth 2 times a day. Take 1 Tab by mouth twice a day. With largest meals 60 tablet 5   . Simvastatin 20 MG Oral Tab TAKE ONE TABLET BY MOUTH ONCE DAILY 90 tablet 2     No current facility-administered medications for this visit.       FH/SH as noted in the EMR.    REVIEW OF SYSTEMS:  Constitutional: No fever, fatigue, or night sweats.   Respiratory: No cough, SOB, or  wheezing.  Cardiovascular: No chest pain or palpitations.  All remaining systems have been reviewed and are negative    PHYSICAL EXAM:  BP: 126/70 mmHg  Pulse: 73  Height: 5' 10.5" (179.1 cm)  Weight: (!) 230 lb (104.327 kg)    CONSTITUTIONAL: No apparent distress. Well nourished and well developed.  HEAD / FACE: Normocephalic.  EYES: No exophthalmus, pupillary reaction is normal and EOM intact.  EARS: Hearing grossly intact.  Tympanic membranes normal.  NOSE / MOUTH / THROAT: No nasal deformity.  Mucous membranes normal.  Tongue normal throat injected but no tonsillar enlargement or exudate .  No mucosal lesions.  Sinuses-nontender  NECK / THYROID: Supple, without adenopathy.  LYMPHATIC: No palpable cervical, supraclavicular  adenopathy.   RESPIRATORY: Normal to inspection.  Lungs clear to auscultation and percussion.  CARDIOVASCULAR: Regular rhythm.  No murmurs, gallops, or rubs.  ABDOMEN: Soft, non-tender, without organomegaly or masses.  MUSCULOSKELETAL: Normal musculature. No skeletal tenderness or joint deformity.  EXTREMITIES:  Extremities appear normal.  No edema or cyanosis.  NEURO: Alert and oriented x 3.     LABORATORY DATA:  Reviewed  labs from the EMR today.    X-RAY REPORTS: Reviewed in EMR.         IMPRESSION/PLAN:  1. Upper respiratory tract infection, unspecified type.  Secondary to viral illness suspected  See AVS    2. Right cervical radiculopathy.  Suspected.  Needing further evaluation and treatment  See AVS  - REFERRAL TO PHYSICAL THERAPY    3. Cough.  Secondary to viral illness suspected  See AVS  - Guaifenesin-Codeine 100-10 MG/5ML Oral Solution; Take 5 mL by mouth every 6 hours as needed for cough.  Dispense: 120 mL; Refill: 0    4. Hoarseness.  Needing further evaluation and treatment  See AVS  - REFERRAL TO OTO-HEAD NECK SURGERY    5. Type 2 diabetes mellitus without complication.  Stable.  See AVS    Patient Instructions   Can you take plenty of liquids, pedialyte, saline nasal spray,  steam, mucinex as an expectorant, and cheratussin cough medicine. Referral for PT as well. Referral to ENT- Dr Juel Burrow as well. Can you make a follow up apt with Dr Lorenso Courier. Can you call Dr Lorenso Courier or covering MD if problems.Thank you for seeing me today.Can you remember to arrive at least 15 minutes early for your next appointment.      Clide Deutscher, MD    [facility] Internal Medicine  [address] [place], [place] [address]  [phone]

## 2014-02-17 NOTE — Progress Notes (Signed)
Does patient have 3 or more complaints:  NO  If yes, discuss with patient the need to schedule a follow up appointment due to limited appointment time.  Follow up appointment scheduled:  NO  Care Everywhere records available/ reconciled:  NO  PHQ2 complete:  YES  PHQ9 complete:  NO  Refills pended:  NO  Orders pended:  NO  Medications to discontinue:  NO    Health Maintenance updated:  YES    Health Maintenance   Topic Date Due   . HEPATITIS C SCREENING  [birth year]   . HIV SCREENING  03/29/1967   . DIABETES EYE EXAM YEARLY  03/29/1970   . ZOSTER VACCINE  03/28/2012   . DIABETES FOOT EXAM YEARLY  09/22/2014   . COLONOSCOPY Q 5 YEARS  06/14/2018   . CHOLESTEROL- MALE  09/22/2018   . TETANUS BOOSTER  11/14/2022   . INFLUENZA VACCINE  Completed

## 2014-04-23 ENCOUNTER — Other Ambulatory Visit (INDEPENDENT_AMBULATORY_CARE_PROVIDER_SITE_OTHER): Payer: Self-pay

## 2014-04-23 DIAGNOSIS — E1165 Type 2 diabetes mellitus with hyperglycemia: Secondary | ICD-10-CM

## 2014-04-23 DIAGNOSIS — IMO0002 Reserved for concepts with insufficient information to code with codable children: Secondary | ICD-10-CM

## 2014-04-24 MED ORDER — GLIMEPIRIDE 1 MG OR TABS
ORAL_TABLET | ORAL | Status: DC
Start: 2014-04-24 — End: 2014-09-20

## 2014-04-24 NOTE — Telephone Encounter (Signed)
Refill Request    Last visit with PCP: 2/16   URI     9/15  physcial  Next visit: ?  Labs: 9/15    Last refill: 9/15  Letter Sent: NO  Diagnosis:    E11.9

## 2014-04-24 NOTE — Telephone Encounter (Signed)
William Bryant--he is long overdue for a repeat A1C which is still pending since September

## 2014-08-06 ENCOUNTER — Other Ambulatory Visit (INDEPENDENT_AMBULATORY_CARE_PROVIDER_SITE_OTHER): Payer: Self-pay

## 2014-08-06 DIAGNOSIS — E118 Type 2 diabetes mellitus with unspecified complications: Secondary | ICD-10-CM

## 2014-08-07 MED ORDER — METFORMIN HCL 1000 MG OR TABS
ORAL_TABLET | ORAL | Status: DC
Start: 2014-08-07 — End: 2014-09-20

## 2014-08-07 MED ORDER — GLIMEPIRIDE 2 MG OR TABS
ORAL_TABLET | ORAL | Status: DC
Start: 2014-08-07 — End: 2014-11-06

## 2014-08-07 NOTE — Telephone Encounter (Signed)
Refill Request    Last visit with PCP: 9/15  physical  Next visit: 09/20/14  Labs: 9/15    Last refill: 4/16  Letter Sent: NO  Diagnosis:     E11.65

## 2014-08-07 NOTE — Telephone Encounter (Signed)
Refill Request    Last visit with PCP: 2/16  Glenford Peers   Physical 9/15  Next visit: 09/20/14  Labs: 9/15    Last refill: 9/15  Letter Sent: NO  Diagnosis:     E11.9

## 2014-08-11 ENCOUNTER — Other Ambulatory Visit (INDEPENDENT_AMBULATORY_CARE_PROVIDER_SITE_OTHER): Payer: Self-pay

## 2014-08-11 DIAGNOSIS — I10 Essential (primary) hypertension: Secondary | ICD-10-CM

## 2014-08-11 NOTE — Telephone Encounter (Signed)
Refill Request    Last visit with PCP: 02/17/14  Next visit: 09/20/14  Labs: 09/21/13 cmp abnormal     Last refill: 10/28/13 # 30   Letter Sent: NO  Diagnosis: Essential hypertension, benign

## 2014-08-12 MED ORDER — BENAZEPRIL HCL 5 MG OR TABS
5.0000 mg | ORAL_TABLET | Freq: Every day | ORAL | Status: DC
Start: 2014-08-12 — End: 2014-09-20

## 2014-09-08 ENCOUNTER — Encounter (INDEPENDENT_AMBULATORY_CARE_PROVIDER_SITE_OTHER): Payer: Self-pay

## 2014-09-20 ENCOUNTER — Encounter (INDEPENDENT_AMBULATORY_CARE_PROVIDER_SITE_OTHER): Payer: Self-pay

## 2014-09-20 ENCOUNTER — Ambulatory Visit (INDEPENDENT_AMBULATORY_CARE_PROVIDER_SITE_OTHER): Payer: 59

## 2014-09-20 ENCOUNTER — Ambulatory Visit: Payer: 59

## 2014-09-20 VITALS — BP 122/76 | HR 68 | Ht 70.5 in | Wt 234.0 lb

## 2014-09-20 DIAGNOSIS — I10 Essential (primary) hypertension: Secondary | ICD-10-CM

## 2014-09-20 DIAGNOSIS — Z114 Encounter for screening for human immunodeficiency virus [HIV]: Secondary | ICD-10-CM

## 2014-09-20 DIAGNOSIS — E782 Mixed hyperlipidemia: Secondary | ICD-10-CM

## 2014-09-20 DIAGNOSIS — Z0000 Encounter for general adult medical examination without abnormal findings: Secondary | ICD-10-CM | POA: Insufficient documentation

## 2014-09-20 DIAGNOSIS — E119 Type 2 diabetes mellitus without complications: Secondary | ICD-10-CM

## 2014-09-20 DIAGNOSIS — K5731 Diverticulosis of large intestine without perforation or abscess with bleeding: Secondary | ICD-10-CM

## 2014-09-20 DIAGNOSIS — N400 Enlarged prostate without lower urinary tract symptoms: Secondary | ICD-10-CM

## 2014-09-20 DIAGNOSIS — Z1159 Encounter for screening for other viral diseases: Secondary | ICD-10-CM

## 2014-09-20 DIAGNOSIS — Z8249 Family history of ischemic heart disease and other diseases of the circulatory system: Secondary | ICD-10-CM

## 2014-09-20 DIAGNOSIS — E118 Type 2 diabetes mellitus with unspecified complications: Secondary | ICD-10-CM

## 2014-09-20 LAB — THYROID STIMULATING HORMONE: Thyroid Stimulating Hormone: 0.685 u[IU]/mL (ref 0.400–5.000)

## 2014-09-20 LAB — CBC, DIFF
% Basophils: 1 %
% Eosinophils: 1 %
% Immature Granulocytes: 0 %
% Lymphocytes: 23 %
% Monocytes: 8 %
% Neutrophils: 67 %
% Nucleated RBC: 0 %
Absolute Eosinophil Count: 0.06 10*3/uL (ref 0.00–0.50)
Absolute Lymphocyte Count: 1.55 10*3/uL (ref 1.00–4.80)
Basophils: 0.04 10*3/uL (ref 0.00–0.20)
Hematocrit: 47 % (ref 38–50)
Hemoglobin: 16.1 g/dL (ref 13.0–18.0)
Immature Granulocytes: 0.03 10*3/uL (ref 0.00–0.05)
MCH: 30.1 pg (ref 27.3–33.6)
MCHC: 34 g/dL (ref 32.2–36.5)
MCV: 89 fL (ref 81–98)
Monocytes: 0.56 10*3/uL (ref 0.00–0.80)
Neutrophils: 4.46 10*3/uL (ref 1.80–7.00)
Nucleated RBC: 0 10*3/uL
Platelet Count: 248 10*3/uL (ref 150–400)
RBC: 5.35 10*6/uL (ref 4.40–5.60)
RDW-CV: 13 % (ref 11.6–14.4)
WBC: 6.7 10*3/uL (ref 4.3–10.0)

## 2014-09-20 LAB — ALBUMIN/CREATININE RATIO, RANDOM URINE
Albumin (Micro), URN: 0.7 mg/dL
Albumin/Creatinine Ratio, URN: 6 mg/g{creat} (ref <30)
Creatinine/Unit, URN: 124 mg/dL

## 2014-09-20 LAB — COMPREHENSIVE METABOLIC PANEL
ALT (GPT): 32 U/L (ref 10–48)
AST (GOT): 18 U/L (ref 9–38)
Albumin: 4.5 g/dL (ref 3.5–5.2)
Alkaline Phosphatase (Total): 58 U/L (ref 37–159)
Anion Gap: 7 (ref 4–12)
Bilirubin (Total): 1.2 mg/dL (ref 0.2–1.3)
Calcium: 9.5 mg/dL (ref 8.9–10.2)
Carbon Dioxide, Total: 28 meq/L (ref 22–32)
Chloride: 100 meq/L (ref 98–108)
Creatinine: 0.76 mg/dL (ref 0.51–1.18)
GFR, Calc, African American: >60 mL/min (ref >59)
GFR, Calc, European American: >60 mL/min (ref >59)
Glucose: 160 mg/dL — ABNORMAL HIGH (ref 62–125)
Potassium: 4.1 meq/L (ref 3.6–5.2)
Protein (Total): 6.8 g/dL (ref 6.0–8.2)
Sodium: 135 meq/L (ref 135–145)
Urea Nitrogen: 17 mg/dL (ref 8–21)

## 2014-09-20 LAB — LIPID PANEL
Cholesterol/HDL Ratio: 5.1
HDL Cholesterol: 36 mg/dL — ABNORMAL LOW (ref >39)
Non-HDL Cholesterol: 147 mg/dL (ref 0–159)
Total Cholesterol: 183 mg/dL (ref <200)
Triglyceride: 476 mg/dL — ABNORMAL HIGH (ref <150)

## 2014-09-20 LAB — PSA, DIAGNOSTIC/MONITORING: PSA, Diagnostic/Monitoring: 1.69 ng/mL (ref 0.00–4.00)

## 2014-09-20 LAB — CK, CREATINE KINASE, TOTAL ACTIVITY: Creatine Kinase Total Activity: 72 U/L (ref 62–325)

## 2014-09-20 MED ORDER — GLIMEPIRIDE 1 MG OR TABS
ORAL_TABLET | ORAL | Status: DC
Start: 2014-09-20 — End: 2014-11-07

## 2014-09-20 MED ORDER — BENAZEPRIL HCL 5 MG OR TABS
5.0000 mg | ORAL_TABLET | Freq: Every day | ORAL | Status: DC
Start: 2014-09-20 — End: 2015-07-18

## 2014-09-20 MED ORDER — METFORMIN HCL 1000 MG OR TABS
ORAL_TABLET | ORAL | Status: DC
Start: 2014-09-20 — End: 2015-07-18

## 2014-09-20 MED ORDER — SIMVASTATIN 20 MG OR TABS
20.0000 mg | ORAL_TABLET | Freq: Every day | ORAL | Status: DC
Start: 2014-09-20 — End: 2015-07-18

## 2014-09-20 NOTE — Progress Notes (Signed)
Does patient have 3 or more complaints:  NO  If yes, discuss with patient the need to schedule a follow up appointment due to limited appointment time.  Follow up appointment scheduled:  NO  Care Everywhere records available/ reconciled:  NO  PHQ2 complete:  YES  PHQ9 complete:  NO  Refills pended:  YES  Orders pended:  YES  Medications to discontinue:  NO    Health Maintenance updated:  NO    Health Maintenance   Topic Date Due   . Hepatitis C Screen  [birth year]   . HIV Screen  October 21, 1950   . Diabetes Eye Exam  03/29/1970   . Zoster Vaccine  03/28/2012   . A1c  03/22/2014   . Influenza Vaccine (1) 09/14/2014   . Diabetes Foot Exam  09/22/2014   . Colonoscopy  06/14/2018   . Cholesterol Test  09/22/2018   . Tetanus Vaccine  11/14/2022

## 2014-09-20 NOTE — Patient Instructions (Signed)
I recommend that you obtain the shingles vaccine (Zostavax) at a local pharmacy as we do not carry it here in the clinic.     Seek immediate attention if symptoms are worsening. Call if symptoms are not resolving with therapy.    A letter will be coming within the next two weeks reviewing the lab results.    Best wishes,    Dr. Hagedorn

## 2014-09-20 NOTE — Progress Notes (Signed)
I, Katy Fitch, MD interviewed and examined the patient while overseeing the documentation performed by my Medical Scribe, Valla Leaver. I have reviewed and revised as necessary the scribe's note and agree with the documented findings and plan of care. Please refer to the scribe's note below for further detailed information regarding the patient encounter and exam.  09/20/2014. 2:36 PM.

## 2014-09-20 NOTE — Progress Notes (Signed)
DATE: 09/20/2014     PHYSICIAN NOTE:  Patient Name: William Bryant  Medical Record Number: [id number]  Visit Date/time: 09/20/2014     Date of Birth: 11-06-1950    CHIEF COMPLAINT:     William Bryant is a 64 year old male who is here today for review of multiple medical problems, and for a wellness exam.    PROBLEMS TO BE ADDRESSED ON TODAYS VISIT:  (Z00.00) Routine general medical examination at a health care facility  (primary encounter diagnosis)  (E11.9) Type 2 diabetes mellitus without complication, without long-term current use of insulin (HCC)  (K57.31) Diverticulosis of large intestine with hemorrhage  (E78.2) Mixed hyperlipidemia  (N40.0) Benign non-nodular prostatic hyperplasia without lower urinary tract symptoms  (Z82.49) Family history of early CAD--mother 46  (I10) Essential hypertension, benign  (E11.8) Type 2 diabetes mellitus with complication, without long-term current use of insulin (HCC)  (Z11.59) Need for hepatitis C screening test  (Z11.4) Screening for HIV (human immunodeficiency virus)    HPI:  History provided by patient.  The patient is fasting.     William Bryant is a 64 year old male who is here today for review of multiple medical problems as reported above, and for a wellness exam. Patient is due for hepatitis C and HIV screen, and zoster vaccine. Patient intends to undergo a diabetic eye exam in the near future. He knows that he is overdue.    Patient reports he will wake up on average once nightly to urinate.    PROBLEM LIST:  Patient Active Problem List   Diagnosis   . Type 2 diabetes mellitus without complication (HCC)   . Lower GI bleed   . Diverticulosis of large intestine with hemorrhage   . Mixed hyperlipidemia   . BPH (benign prostatic hyperplasia)   . Erectile dysfunction   . Family history of early CAD--mother 31   . Right cervical radiculopathy   . Upper respiratory tract infection   . Cough   . Hoarseness       SURGICAL HISTORY:  Past Surgical History   Procedure  Laterality Date   . Knee replacement x2         MEDICATIONS:  Current Outpatient Prescriptions   Medication Sig Dispense Refill   . Aspirin 81 MG Oral Chew Tab Take 81 mg by mouth every day.     . Benazepril HCl 5 MG Oral Tab Take 1 tablet (5 mg) by mouth daily. Take 1 Tab by mouth daily at bedtime. To prevent kidney damage. 90 tablet 2   . Cholecalciferol (D 2000) 2000 UNITS Oral Tab Take 1 Tab by mouth every day.     . Glimepiride 1 MG Oral Tab TAKE ONE TABLET BY MOUTH TWICE DAILY BEFORE BREAKFAST AND DINNER. 180 tablet 2   . Glimepiride 2 MG Oral Tab TAKE ONE-HALF TABLET BY MOUTH TWICE DAILY BEFORE BREAKFAST AND  DINNER 30 tablet 1   . Guaifenesin-Codeine 100-10 MG/5ML Oral Solution Take 5 mL by mouth every 6 hours as needed for cough. 120 mL 0   . MetFORMIN HCl 1000 MG Oral Tab TAKE ONE TABLET BY MOUTH TWICE DAILY WITH LARGEST MEALS. 180 tablet 2   . Simvastatin 20 MG Oral Tab Take 1 tablet (20 mg) by mouth daily. 90 tablet 2     No current facility-administered medications for this visit.       ALLERGIES:  Review of patient's allergies indicates:  No Known Allergies    FAMILY HISTORY:  Family History   Problem Relation Age of Onset   . Cancer Father      patient believes colon cancer   . Breast Cancer Sister    . Deep Vein Thrombosis Father    . Myocardial Infarction       grandfather        SOCIAL HISTORY:  Social History     Social History   . Marital Status: N/A     Spouse Name: N/A   . Number of Children: N/A   . Years of Education: N/A     Occupational History   . Not on file.     Social History Main Topics   . Smoking status: Former Smoker -- 22 years     Types: Cigarettes     Quit date: 02/13/1998   . Smokeless tobacco: Never Used   . Alcohol Use: 1.0 oz/week     1 Glasses of wine, 1 Cans of beer per week   . Drug Use: No   . Sexual Activity: Not on file     REVIEW OF SYSTEMS:     Male    CONSTITUTIONAL: Denies, fatigue, unexpected weight loss, unexpected weight gain  NEUROLOGIC: Denies, headaches and  tremors  OPHTHALMIC: Denies, blurry vision, diplopia, eye pain  ENT: Denies, hearing difficulties, nasal congestion and any ear nose or throat problems  RESPIRATORY: Denies, dyspnea on exertion, dyspnea at rest, cough, wheezing  CARDIOVASCULAR: Denies, chest pain or pressure at rest or during exercise, palpitations  GI: H/o diverticulitis  GENITOURINARY: H/o BPH, ED  INTEGUMENTARY: Denies, skin rash, easy bruising, hair loss, changes in moles or new moles  RHEUMATOLOGIC/MSK: H/o right cervical radiculopathy  ENDOCRINE: type II DM, mixed hyperlipidemia   HEME/LYMPH:  Denies, Easy bruising, Lumps under arms or around neck and anemia  PSYCHOSOCIAL: Denies and any psychological problems      PHYSICAL EXAM:  BP 122/76 mmHg  Pulse 68  Ht 5' 10.5" (1.791 m)  Wt 234 lb (106.142 kg)  BMI 33.09 kg/m2    Physical Examination-Male: Vital signs as noted above.    General Appearance and Mental Status: Well Developed/Well Nourished   Slender   Average   Heavy [x ] Obese  [x ] Appropriate Affect, Oriented to Person, Place, Time      Skin: Warm, dry to touch    Head: Normocephalic, atraumatic    Eyes: EOMI, PERLA    Ears: TM's clear bilaterally    Nose: Septum intact, no lesions, pink mucosa and turbinates, no discharge    Pharynx: Clear    Neck: Supple, without JVD  or goiter    Chest: Clear     CV: Heart: RRR, no murmur, no rub, no S3, S4 noted    Abdomen: Soft, bowel sounds present, without masses or tenderness noted, no hepatosplenomegaly noted    Male GU:      Testes: nontender, no masses noted  Penis:  no lesions, no discharge  Prostate: size: 4+ smooth, no discrete nodules appreciated  Rectal:  no masses noted, normal sphincter tone    Extrem: no edema    MSK:   normal gait and station, no clubbing, no cyanosis;  upper and lower extremities                show no defects in symmetry, crepitance, tenderness,  masses, or effusion              normal range of motion without dislocation, subluxation or laxity; normal  muscle               tone and strength.    Neuro: intact       LAB RESULTS:  Pending.    ASSESSMENT AND PLAN:  Pertinent Labs & Imaging studies reviewed. (See chart for details)  Prior EMR records reviewed in EPIC as available and clinically relevant.    1. Routine general medical examination at a health care facility  I performed a routine general examination today and ordered routine fasting labs including TSH, PSA, and A1C for further evaluation.  - Glimepiride 1 MG Oral Tab; TAKE ONE TABLET BY MOUTH TWICE DAILY BEFORE BREAKFAST AND DINNER.  Dispense: 180 tablet; Refill: 2  - CBC, DIFF  - COMPREHENSIVE METABOLIC PANEL  - LIPID PANEL  - PROSTATE SPECIFIC ANTIGEN  - THYROID STIMULATING HORMONE  - HEMOGLOBIN A1C, HPLC  - CREATINE KINASE TOTAL ACTIVITY  - URINE SCREEN, MICROALBUMINURIA  - Hepatitis C Ab with REFLEX PCR  - HIV ANTIGEN AND ANTIBODY SCRN    2. Type 2 diabetes mellitus without complication, without long-term current use of insulin (HCC)  Patient is due for a diabetic eye exam. I referred him to Dr. Clide Deutscher for further evaluation and treatment. I refilled Metformin 1000mg  twice daily.   - REFERRAL TO EYE CARE  - MetFORMIN HCl 1000 MG Oral Tab; TAKE ONE TABLET BY MOUTH TWICE DAILY WITH LARGEST MEALS.  Dispense: 180 tablet; Refill: 2    3. Diverticulosis of large intestine with hemorrhage  Controlled.     4. Mixed hyperlipidemia  I ordered a fasting lipid panel for further evaluation and refilled Simvastatin 20mg  daily for same.   - Simvastatin 20 MG Oral Tab; Take 1 tablet (20 mg) by mouth daily.  Dispense: 90 tablet; Refill: 2    5. Benign non-nodular prostatic hyperplasia without lower urinary tract symptoms  I ordered a PSA for further evaluation.     6. Family history of early CAD--mother 63  Controlled.     7. Essential hypertension, benign  Patient has a h/o HTN. His blood pressure today is good, 122/76. I ordered a CBC and CMP for further evaluation. I refilled Benazepril 5mg  daily for same.   -  Benazepril HCl 5 MG Oral Tab; Take 1 tablet (5 mg) by mouth daily. Take 1 Tab by mouth daily at bedtime. To prevent kidney damage.  Dispense: 90 tablet; Refill: 2    8. Need for hepatitis C screening test  Patient is due for a hepatitis C screening. Routine screening done today.    9. Screening for HIV (human immunodeficiency virus)  Patient is due for a HIV screening. Routine screening done today.     I recommend that he obtain the shingles vaccine (Zostavax) at a local pharmacy as we do not carry it here in the clinic.       Further treatment dependant on the results of the tests.    Seek immediate attention if symptoms are worsening.    Call if symptoms are not resolving with therapy.      HEALTH MAINTENANCE:     Social History Narrative    Married with one son. Medical and legal interpreter, self employed. Rare ETOH. Former smoker. No social drug use. Rare exercise.         Colonoscopy performed in June 2015 with Dr. Jaynie Collins, cleared for five years         Ophthalmology visit- referral to Ahmed Prima on 09/20/14  TDAP- 11/2012    Zoster-advised 09/20/14        Hepatitis C and HIV screening-09/20/14           Portions of this chart were written by Valla Leaver, Medical Scribe with oversight by Katy Fitch, MD 09/20/2014 2:04 PM    I,  Katy Fitch, have reviewed the information recorded by the scribe for accuracy and agree with its contents.    This medical record has been electronically generated with voice recognition software. Every attempt is made to assure accuracy.  However, it may contain errors related to this process.

## 2014-09-21 ENCOUNTER — Encounter (INDEPENDENT_AMBULATORY_CARE_PROVIDER_SITE_OTHER): Payer: Self-pay

## 2014-09-21 LAB — HEPATITIS C AB WITH REFLEX PCR: Hepatitis C Antibody w/Rflx PCR: NONREACTIVE

## 2014-09-21 LAB — HIV ANTIGEN AND ANTIBODY SCRN
HIV Antigen and Antibody Interpretation: NONREACTIVE
HIV Antigen and Antibody Result: NONREACTIVE

## 2014-09-21 LAB — HEMOGLOBIN A1C, HPLC: Hemoglobin A1C: 8.1 % — ABNORMAL HIGH (ref 4.0–6.0)

## 2014-10-10 ENCOUNTER — Encounter (INDEPENDENT_AMBULATORY_CARE_PROVIDER_SITE_OTHER): Payer: Self-pay

## 2014-11-05 ENCOUNTER — Other Ambulatory Visit (INDEPENDENT_AMBULATORY_CARE_PROVIDER_SITE_OTHER): Payer: Self-pay

## 2014-11-05 DIAGNOSIS — E118 Type 2 diabetes mellitus with unspecified complications: Secondary | ICD-10-CM

## 2014-11-07 ENCOUNTER — Other Ambulatory Visit: Payer: Self-pay

## 2014-11-07 DIAGNOSIS — E119 Type 2 diabetes mellitus without complications: Secondary | ICD-10-CM

## 2014-11-07 MED ORDER — GLIMEPIRIDE 2 MG OR TABS
1.0000 mg | ORAL_TABLET | Freq: Two times a day (BID) | ORAL | Status: DC
Start: 2014-11-07 — End: 2016-03-06

## 2014-11-24 ENCOUNTER — Ambulatory Visit (INDEPENDENT_AMBULATORY_CARE_PROVIDER_SITE_OTHER): Payer: 59

## 2014-11-24 DIAGNOSIS — Z23 Encounter for immunization: Secondary | ICD-10-CM

## 2014-11-24 NOTE — Progress Notes (Signed)
VACCINE SCREENING/ORDER WHEN CONTRAINDICATIONS PRESENT    Order date: 11/24/2014  Ordering provider:   Clinic stock used:   Interpreter used?: None  Vaccine information sheet(s) discussed, patient/parent/guardian verbalized understanding? YES   Vaccines given: No orders of the defined types were placed in this encounter.     VIS given 11/24/2014 by Kathrin PennerJana Bercowitz MA.      SCREENING QUESTIONS: INACTIVATED INFLUENZA VACCINE:  1. Do you have a high fever, severe cold-like symptoms or serious infection? NO   2. Do you have any food allergies such as eggs, chicken, chicken feathers, chicken dander, or gelatin? NO  3. Have you had a severe reaction to a vaccine in the past? NO  4. Have you had any severe reaction to a vaccine in the past such as a seizure, a convulsion from a high fever, or a paralysis problem called Guillain-Barre Syndrome? NO  5. QUESTIONS FOR WOMEN: Are you pregnant or planning on becoming pregnant in the next month? (If YES, you may give Fluarix or Fluzone Pre-Filled syringe rather than Multi-Dose Fluzone). NO    If the patient/parent/or guardian answered "yes" to any of the above questions, consult with the provider to review the responses prior to administering vaccination. Parents with a contraindication to immunization will not receive the immunization without a specific physician order to vaccinate the patient and discussion of risk and benefits related to the contraindication.     Physician Order for Administration of Vaccine(s) When Contraindications Are Present  I have reviewed the existence in this patient's health history of possible contraindication(s) to administration of vaccine. The benefits to this patient outweigh the potential risks of immunization. Administer vaccine(s): influenza  Kathrin PennerBercowitz, Jana, 11/24/2014 10:36 AM MA.

## 2014-12-18 ENCOUNTER — Encounter (INDEPENDENT_AMBULATORY_CARE_PROVIDER_SITE_OTHER): Payer: Self-pay

## 2014-12-18 ENCOUNTER — Ambulatory Visit (INDEPENDENT_AMBULATORY_CARE_PROVIDER_SITE_OTHER): Payer: 59

## 2014-12-18 ENCOUNTER — Ambulatory Visit: Payer: 59

## 2014-12-18 ENCOUNTER — Telehealth (INDEPENDENT_AMBULATORY_CARE_PROVIDER_SITE_OTHER): Payer: Self-pay

## 2014-12-18 VITALS — BP 120/80 | HR 80 | Ht 71.0 in | Wt 221.0 lb

## 2014-12-18 DIAGNOSIS — A09 Infectious gastroenteritis and colitis, unspecified: Secondary | ICD-10-CM | POA: Insufficient documentation

## 2014-12-18 DIAGNOSIS — R197 Diarrhea, unspecified: Secondary | ICD-10-CM

## 2014-12-18 DIAGNOSIS — R05 Cough: Secondary | ICD-10-CM

## 2014-12-18 DIAGNOSIS — R059 Cough, unspecified: Secondary | ICD-10-CM

## 2014-12-18 NOTE — Progress Notes (Signed)
DATE: 12/18/2014     PHYSICIAN NOTE:  Patient Name: William Bryant  Medical Record Number: [id number]  Visit Date/time: 12/18/2014     Date of Birth: [birth year]    CHIEF COMPLAINT:    William Bryant is a 64 year old male who is here today due to diarrhea.     PROBLEMS TO BE ADDRESSED ON TODAYS VISIT:  (A09) Diarrhea of presumed infectious origin  (primary encounter diagnosis)  (R05) Cough    HPI:  History provided by patient.    William Bryant is a 64 year old male who is here today due to diarrhea. Patient reports watery diarrhea, onset 10/17/14. He returned from [place] on 10/16/14. The day after returning, he woke up and began experiencing fatigue, sore throat, dry cough, chills, and diarrhea. He had about at least fives BMs yesterday and at least three today. He notes one episode of emesis, denies hematochezia, abdominal pain, and fever. He notes no one else in his family has these symptoms and he was the only person to drink water on the plane. He feels better today than he did yesterday.       Past Medical History:  As cited in EHR, updated and reviewed by myself on 12/18/2014    Family History:  As cited in EHR, updated and reviewed by myself on 12/18/2014    Medications/Allergies:  As cited in EHR, updated and reviewed by myself on 12/18/2014      REVIEW OF SYSTEMS:  Male    CONSTITUTIONAL: Reports fatigue, chills; Denies fever  ENT: Reports sore throat  RESPIRATORY: Reports dry cough  CARDIOVASCULAR: H/o CAD  GI: H/o diverticulosis; Reports diarrhea, emesis; Denies abdominal pain      PHYSICAL EXAM:  BP 120/80 mmHg  Pulse 80  Ht 5\' 11"  (1.803 m)  Wt 221 lb (100.245 kg)  BMI 30.84 kg/m2  Physical Exam:  Vital signs as reported above.    Skin: Warm, dry to touch    Head: Normocephalic, atraumatic    Eyes: EOMI    Neck: Supple, without JVD  or goiter    Chest: Clear     CV: Heart: RRR, no murmur, no rub, no S3, S4 noted    Abd:  Soft.  BS present.  No masses, or tenderness noted.    Extrem: no  edema    MSK: normal gait and station, no clubbing, no cyanosis;  upper and lower extremities show no defects in symmetry, crepitance, tenderness, masses, or effusions;  normal range of motion without dislocation, subluxation or laxity; normal muscle tone and strength.    Neuro: intact      LAB RESULTS:  Pending.     ASSESSMENT AND PLAN:  Pertinent Labs & Imaging studies reviewed. (See chart for details)  Prior EMR records reviewed in EPIC as available and clinically relevant.    1. Diarrhea of presumed infectious origin  Patient reports diarrhea, emesis (one episode), dry cough, sore throat, chills, and fatigue, sine returning from [place] on 12/16/14. He notes about five BMs yesterday and three BMs today. I ordered a stool culture, giardia, C. Diff, and stool ova and parasite for further evaluation.  This may well be part of a viral syndrome.  Avoid milk products.  Liberalize salt and electrolytes for the next two days.  The BRAT diet was reviewed.  - STOOL C/S AND ENTERIC BATTERY  - GIARDIA ANTIGEN (STOOL)  - C. DIFFICILE, TOXIGENIC PCR  - STOOL OVA AND PARASITE EXAM  2. Cough  See #1.       This appointment took 15 minutes and more than 50% was spent in counseling and/or coordination of care.     Further treatment dependant on the results of the tests.    Seek immediate attention if symptoms are worsening.    Call if symptoms are not resolving with therapy.    Portions of this chart were written by Valla Leaver, Medical Scribe with oversight by Katy Fitch, MD 12/18/2014 5:01 PM    I,  Katy Fitch, have reviewed the information recorded by the scribe for accuracy and agree with its contents.    This medical record has been electronically generated with voice recognition software. Every attempt is made to assure accuracy.   However. it may contain errors related to this process.

## 2014-12-18 NOTE — Progress Notes (Signed)
I, Katy FitchJoel Nason Conradt, MD interviewed and examined the patient while overseeing the documentation performed by my Medical Scribe, Valla LeaverBreanne Wiekamp. I have reviewed and revised as necessary the scribe's note and agree with the documented findings and plan of care. Please refer to the scribe's note below for further detailed information regarding the patient encounter and exam.  12/18/2014. 7:38 PM.

## 2014-12-18 NOTE — Telephone Encounter (Signed)
Flu-like symptoms, no fever, but concerned about watery diarrhea for over a day.  Please advise [phone]

## 2014-12-18 NOTE — Telephone Encounter (Signed)
3:13- Patient called again-is concerned.  Patient would like to be seen today.

## 2014-12-18 NOTE — Telephone Encounter (Signed)
Appt given today

## 2014-12-18 NOTE — Patient Instructions (Addendum)
Please consume a large amount of liquids including water, gatorade, and soup broth with salt. Please avoid all dairy products and fruit, except bananas.     Seek immediate attention if symptoms are worsening. Call if symptoms are not resolving with therapy.    A letter will be coming within the next two weeks reviewing the lab results.    Happy holidays,    Dr. Lorenso CourierHagedorn    Diet for Vomiting or Diarrhea (Adult)    Your symptoms may return or get worse after eating certain foods listed below. If this happens, stop eating these foods until your symptoms ease and you feel better.  Once the vomiting stops, follow the steps below.  During the first 12 to 24 hours  During the first 12 to 24 hours, follow this diet:   Drinks.Plain water, sport drinks like electrolyte solutions, soft drinks without caffeine, mineral water (plain or flavored), clear fruit juices, and decaffeinated tea and coffee.   Soups.Clear broth.   Desserts. Plain gelatin, popsicles, and fruit juice bars. As you feel better, you may add 6to 8 ounces of yogurt per day. If you have diarrhea, don't havefoods or drinks that contain sugar, high-fructose corn syrup, or sugar alcohols.  During the next 24 hours  During the next 24 hours you may add the following to the above:   Hot cereal, plain toast, bread, rolls, and crackers   Plain noodles, rice, mashed potatoes, and chicken noodle or rice soup   Unsweetened canned fruit (but not pineapple) and bananas  Don't eat more than15 grams of fat a day. Do this by staying away from margarine, butter, oils, mayonnaise, sauces, gravies, fried foods, peanut butter, meat, poultry, and fish.  Don't eat much fiber. Stay away fromraw or cooked vegetables, fresh fruits (except bananas), and bran cereals.  Limit how muchcaffeine and chocolate you have. Do not use anyspices or seasonings except salt.  During the next 24 hours  Slowly go back to your normal diet, as you feel better and your symptoms ease.    2000-2016 [employer] [address], [place], [place] [id number]. All rights reserved. This information is not intended as a substitute for professional medical care. Always follow your healthcare professional's instructions.

## 2014-12-19 ENCOUNTER — Ambulatory Visit: Payer: 59

## 2014-12-19 LAB — C. DIFFICILE TOXIN GENE BY PCR: C. difficile Toxin Gene by PCR: NEGATIVE

## 2014-12-20 LAB — GIARDIA ANTIGEN (STOOL): Serology: Parasitic Giardia Antigen: NEGATIVE

## 2014-12-22 LAB — EXTENDED STOOL PARASITE MICROSCOPIC EXAM: Exam: NONE SEEN

## 2014-12-23 LAB — STOOL C/S AND ENTERIC BATTERY: SHIGA TOXINS: NOT DETECTED

## 2014-12-25 ENCOUNTER — Encounter (INDEPENDENT_AMBULATORY_CARE_PROVIDER_SITE_OTHER): Payer: Self-pay

## 2015-05-22 ENCOUNTER — Encounter (INDEPENDENT_AMBULATORY_CARE_PROVIDER_SITE_OTHER): Payer: Self-pay

## 2015-05-22 ENCOUNTER — Ambulatory Visit (INDEPENDENT_AMBULATORY_CARE_PROVIDER_SITE_OTHER): Payer: 59

## 2015-05-22 VITALS — BP 148/83 | HR 82 | Temp 97.9°F | Resp 16 | Wt 231.5 lb

## 2015-05-22 DIAGNOSIS — S29012A Strain of muscle and tendon of back wall of thorax, initial encounter: Secondary | ICD-10-CM

## 2015-05-22 DIAGNOSIS — M542 Cervicalgia: Secondary | ICD-10-CM

## 2015-05-22 DIAGNOSIS — S46912A Strain of unspecified muscle, fascia and tendon at shoulder and upper arm level, left arm, initial encounter: Secondary | ICD-10-CM

## 2015-05-22 DIAGNOSIS — M25512 Pain in left shoulder: Secondary | ICD-10-CM
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
## 2015-05-31 ENCOUNTER — Encounter (INDEPENDENT_AMBULATORY_CARE_PROVIDER_SITE_OTHER): Payer: Self-pay | Admitting: Internal Medicine

## 2015-05-31 ENCOUNTER — Ambulatory Visit (INDEPENDENT_AMBULATORY_CARE_PROVIDER_SITE_OTHER): Payer: 59 | Admitting: Internal Medicine

## 2015-05-31 DIAGNOSIS — M542 Cervicalgia: Secondary | ICD-10-CM

## 2015-05-31 DIAGNOSIS — M25512 Pain in left shoulder: Secondary | ICD-10-CM
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
## 2015-05-31 NOTE — Patient Instructions (Addendum)
We ordered referral to physical therapy.  You need to call their office to schedule appointment.  Advised short-term use of over-the-counter NSAIDs like ibuprofen or naproxen as needed, if tolerated.   Follow with your primary care provider in 3-4 weeks or sooner if needed

## 2015-06-07 ENCOUNTER — Ambulatory Visit (INDEPENDENT_AMBULATORY_CARE_PROVIDER_SITE_OTHER): Payer: 59

## 2015-06-07 VITALS — BP 135/80 | HR 85 | Temp 98.2°F

## 2015-06-07 DIAGNOSIS — S46912D Strain of unspecified muscle, fascia and tendon at shoulder and upper arm level, left arm, subsequent encounter: Secondary | ICD-10-CM

## 2015-06-07 DIAGNOSIS — S161XXA Strain of muscle, fascia and tendon at neck level, initial encounter: Secondary | ICD-10-CM | POA: Insufficient documentation

## 2015-06-07 DIAGNOSIS — S46912A Strain of unspecified muscle, fascia and tendon at shoulder and upper arm level, left arm, initial encounter: Secondary | ICD-10-CM | POA: Insufficient documentation

## 2015-06-07 DIAGNOSIS — S161XXD Strain of muscle, fascia and tendon at neck level, subsequent encounter: Secondary | ICD-10-CM
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
## 2015-06-18 ENCOUNTER — Ambulatory Visit (INDEPENDENT_AMBULATORY_CARE_PROVIDER_SITE_OTHER): Payer: 59

## 2015-06-18 DIAGNOSIS — S161XXD Strain of muscle, fascia and tendon at neck level, subsequent encounter: Secondary | ICD-10-CM

## 2015-06-18 DIAGNOSIS — S46912D Strain of unspecified muscle, fascia and tendon at shoulder and upper arm level, left arm, subsequent encounter: Secondary | ICD-10-CM

## 2015-06-18 NOTE — Progress Notes (Signed)
[facility]  Daily Treatment Note    Bo Merinondrew William Radebaugh  Today's date: 06/18/2015  Referring Provider: Dyann RuddleAndre Cameron Zand    Time In: 1:30 Time Out: 2:58 Total Time: 88 minutes  Treatment Diagnosis:   Encounter Diagnoses   Name Primary?   . Cervical strain, subsequent encounter    . Strain of left shoulder, subsequent encounter          SUBJECTIVE:   Greig Castillandrew states that he is feeling ok with the HEP. Still painful to turn his head or reaching OH.    OBJECTIVE:   MMT: IR 4-/5 min pain    TREATMENT:   Today's treatment consisted of Manual Therapy x 18 minutes using soft tissue mobilizations and joint mobilizations focusing on the C/S and shoulder. Manual therapy performed to increase circulation, decrease tissue tightness, and reduce soreness. Therapeutic exercises x 35 minutes, along with neuromuscular re-education x 20 minutes as per flow sheet. Exercises performed to increase muscle strength, muscle coordination, movement quality, proprioception, exercise tolerance, and muscle endurance. Electrical stimulation with ice x 15 minutes was performed intensity to tolerance with an interferential setting to the shoulder to help reduce soreness and pain.      ASSESSMENT:   The patient tolerated today's appointment good/normal. Patient shows improved pain level with resisted IR today.    PLAN:   The current physical therapy regimen is effective;  continue present plan of care. Modify exercises and plan as needed.     Electrically signed,   Rolene CourseKayoko Sobey, MPT, COMT    [facility]

## 2015-06-22 ENCOUNTER — Ambulatory Visit (INDEPENDENT_AMBULATORY_CARE_PROVIDER_SITE_OTHER): Payer: 59

## 2015-06-22 DIAGNOSIS — S46912D Strain of unspecified muscle, fascia and tendon at shoulder and upper arm level, left arm, subsequent encounter: Secondary | ICD-10-CM

## 2015-06-22 DIAGNOSIS — S161XXD Strain of muscle, fascia and tendon at neck level, subsequent encounter: Secondary | ICD-10-CM

## 2015-06-22 NOTE — Progress Notes (Signed)
[facility]  Daily Treatment Note    Bo Merinondrew William Bryant  Today's date: 06/22/2015  Referring Provider: Dyann RuddleAndre Cameron Zand    Time In: 9:35 Time Out: 10:54 Total Time: 79 minutes  Treatment Diagnosis:   Encounter Diagnoses   Name Primary?   . Cervical strain, subsequent encounter    . Strain of left shoulder, subsequent encounter          SUBJECTIVE:   William Bryant states that shoulder continues to be sore. Reaching forward is improving, but sideway remains painful.    OBJECTIVE:   AROM GH Flexion at 90 pain increase    TREATMENT:   Today's treatment consisted of Manual Therapy x 20 minutes using soft tissue mobilizations and joint mobilizations focusing on the shoulder and C/S. Manual therapy performed to increase circulation, decrease tissue tightness, and reduce soreness. Therapeutic exercises x 29 minutes, along with neuromuscular re-education x 15 minutes as per flow sheet. Exercises performed to increase muscle strength, muscle coordination, movement quality, proprioception, exercise tolerance, and muscle endurance. Electrical stimulation with ice x 15 minutes was performed intensity to tolerance with an interferential setting to the shoulder to help reduce soreness and pain.      ASSESSMENT:   The patient tolerated today's appointment good/normal. Patient shows improvement in ROM today.    PLAN:   The current physical therapy regimen is effective;  continue present plan of care. Modify exercises and plan as needed.     Electrically signed,   Rolene CourseKayoko Sobey, MPT, COMT    [facility]

## 2015-06-25 ENCOUNTER — Ambulatory Visit: Payer: 59

## 2015-06-25 ENCOUNTER — Ambulatory Visit (INDEPENDENT_AMBULATORY_CARE_PROVIDER_SITE_OTHER): Payer: 59

## 2015-06-25 VITALS — BP 128/80 | HR 72 | Ht 71.0 in | Wt 228.0 lb

## 2015-06-25 DIAGNOSIS — E7800 Pure hypercholesterolemia, unspecified: Secondary | ICD-10-CM

## 2015-06-25 DIAGNOSIS — E119 Type 2 diabetes mellitus without complications: Secondary | ICD-10-CM | POA: Insufficient documentation

## 2015-06-25 DIAGNOSIS — M25512 Pain in left shoulder: Secondary | ICD-10-CM

## 2015-06-25 DIAGNOSIS — S161XXD Strain of muscle, fascia and tendon at neck level, subsequent encounter: Secondary | ICD-10-CM

## 2015-06-25 DIAGNOSIS — S46912D Strain of unspecified muscle, fascia and tendon at shoulder and upper arm level, left arm, subsequent encounter: Secondary | ICD-10-CM

## 2015-06-25 LAB — CK, CREATINE KINASE, TOTAL ACTIVITY: Creatine Kinase Total Activity: 62 U/L (ref 62–325)

## 2015-06-25 LAB — COMPREHENSIVE METABOLIC PANEL
ALT (GPT): 29 U/L (ref 10–48)
AST (GOT): 20 U/L (ref 9–38)
Albumin: 4.5 g/dL (ref 3.5–5.2)
Alkaline Phosphatase (Total): 56 U/L (ref 37–159)
Anion Gap: 9 (ref 4–12)
Bilirubin (Total): 1.4 mg/dL — ABNORMAL HIGH (ref 0.2–1.3)
Calcium: 9.5 mg/dL (ref 8.9–10.2)
Carbon Dioxide, Total: 27 meq/L (ref 22–32)
Chloride: 103 meq/L (ref 98–108)
Creatinine: 0.75 mg/dL (ref 0.51–1.18)
GFR, Calc, African American: >60 mL/min/{1.73_m2} (ref >59)
GFR, Calc, European American: >60 mL/min/{1.73_m2} (ref >59)
Glucose: 123 mg/dL (ref 62–125)
Potassium: 4.3 meq/L (ref 3.6–5.2)
Protein (Total): 6.6 g/dL (ref 6.0–8.2)
Sodium: 139 meq/L (ref 135–145)
Urea Nitrogen: 20 mg/dL (ref 8–21)

## 2015-06-25 LAB — ALBUMIN/CREATININE RATIO, RANDOM URINE
Albumin (Micro), URN: <0.7 mg/dL
Albumin/Creatinine Ratio, URN: <6 mg/g{creat} (ref <30)
Creatinine/Unit, URN: 126 mg/dL

## 2015-06-25 LAB — LIPID PANEL
Cholesterol (LDL): 60 mg/dL (ref <130)
Cholesterol/HDL Ratio: 3.3
HDL Cholesterol: 40 mg/dL (ref >39)
Non-HDL Cholesterol: 91 mg/dL (ref 0–159)
Total Cholesterol: 131 mg/dL (ref <200)
Triglyceride: 155 mg/dL — ABNORMAL HIGH (ref <150)
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
## 2015-06-25 NOTE — Patient Instructions (Signed)
Seek immediate attention if symptoms are worsening. Call if symptoms are not resolving with therapy.    A letter will be coming within the next two weeks reviewing the lab results.    Best wishes,    Dr. Hagedorn

## 2015-06-25 NOTE — Progress Notes (Signed)
[facility]  Daily Treatment Note    Bo Merinondrew William Fedewa  Today's date: 06/25/2015  Referring Provider: Dyann RuddleAndre Cameron Zand    Time In: 1:55 Time Out: 3:10 Total Time: 75 minutes  Treatment Diagnosis:   Encounter Diagnoses   Name Primary?   . Cervical strain, subsequent encounter    . Strain of left shoulder, subsequent encounter          SUBJECTIVE:   William Bryant states that he is having a hard time with his shoulder. He continues to have pain, and worse this past 2-3 days. We went to see his MD, and got a referral to a specialist.     OBJECTIVE:   AROM GH Flex past 100 pain  Atrhophy on posterior deltoid area.    TREATMENT:   Today's treatment consisted of Manual Therapy for 15 minutes on his shoulder to increase vascularity, decrease muscle guarding and pain. Therapeutic exercises x 30 minutes, along with neuromuscular re-education x 15 minutes as per flow sheet. Exercises performed to increase muscle strength, muscle coordination, movement quality, proprioception, exercise tolerance, and muscle endurance. Electrical stimulation with ice x 15 minutes was performed intensity to tolerance with an interferential setting to the shoulder to help reduce soreness and pain.      ASSESSMENT:   The patient tolerated today's appointment good/normal. At this point, due to the lack of progress, we have discussed and agreed on his PCP's decision to see the specialist and possible imaging.    PLAN:   The current physical therapy regimen is effective;  continue present plan of care. Modify exercises and plan as needed.     Electrically signed,   Rolene CourseKayoko Sobey, MPT, COMT    [facility]

## 2015-06-25 NOTE — Progress Notes (Signed)
I, Katy FitchJoel Mitul Hallowell, MD interviewed and examined the patient while overseeing the documentation performed by my Medical Scribe, Valla LeaverBreanne Wiekamp. I have reviewed and revised as necessary the scribe's note and agree with the documented findings and plan of care. Please refer to the scribe's note below for further detailed information regarding the patient encounter and exam.  06/25/2015. 2:38 PM.

## 2015-06-26 ENCOUNTER — Encounter (INDEPENDENT_AMBULATORY_CARE_PROVIDER_SITE_OTHER): Payer: Self-pay

## 2015-06-26 LAB — HEMOGLOBIN A1C, HPLC: Hemoglobin A1C: 7.3 % — ABNORMAL HIGH (ref 4.0–6.0)

## 2015-06-28 ENCOUNTER — Ambulatory Visit (INDEPENDENT_AMBULATORY_CARE_PROVIDER_SITE_OTHER): Payer: 59

## 2015-06-28 DIAGNOSIS — S161XXD Strain of muscle, fascia and tendon at neck level, subsequent encounter: Secondary | ICD-10-CM

## 2015-06-28 DIAGNOSIS — S46912D Strain of unspecified muscle, fascia and tendon at shoulder and upper arm level, left arm, subsequent encounter: Secondary | ICD-10-CM

## 2015-06-28 NOTE — Progress Notes (Signed)
[facility]  Daily Treatment Note    William Bryant  Today's date: 06/28/2015  Referring Provider: Dyann RuddleAndre Cameron Zand    Time In: 1:01 Time Out: 2:25 Total Time: 84 minutes  Treatment Diagnosis:   Encounter Diagnoses   Name Primary?   . Cervical strain, subsequent encounter    . Strain of left shoulder, subsequent encounter          SUBJECTIVE:   William Bryant states that has an appointment with Dr. Rob BuntingStorey tomorrow.     OBJECTIVE:   AROM C/S L. Rot 80% with pain, TTP on lateral/mid deltoid as well as rhomboids, upper trap area.    TREATMENT:   Today's treatment consisted of Manual Therapy x 15 minutes using soft tissue mobilizations and joint mobilizations focusing on the shoulder and C/S. Manual therapy performed to increase circulation, decrease tissue tightness, and reduce soreness. Therapeutic exercises x 35 minutes, along with neuromuscular re-education x 19 minutes as per flow sheet. Exercises performed to increase muscle strength, muscle coordination, movement quality, proprioception, exercise tolerance, and muscle endurance. Electrical stimulation with ice x 15 minutes was performed intensity to tolerance with an interferential setting to the shoulder to help reduce soreness and pain.      ASSESSMENT:   The patient tolerated today's appointment good/normal. Patient shows not much of improvement for the past 4 visits. He would benefit from further imaging and diagnostic testing.    PLAN:   The current physical therapy regimen is effective;  continue present plan of care. Modify exercises and plan as needed.     Electrically signed,   Rolene CourseKayoko Sobey, MPT, COMT    [facility]

## 2015-06-29 ENCOUNTER — Ambulatory Visit (INDEPENDENT_AMBULATORY_CARE_PROVIDER_SITE_OTHER): Payer: 59 | Admitting: Family Medicine

## 2015-06-29 ENCOUNTER — Encounter (INDEPENDENT_AMBULATORY_CARE_PROVIDER_SITE_OTHER): Payer: Self-pay | Admitting: Family Medicine

## 2015-06-29 VITALS — BP 130/70 | HR 73 | Ht 71.0 in | Wt 228.0 lb

## 2015-06-29 DIAGNOSIS — S161XXA Strain of muscle, fascia and tendon at neck level, initial encounter: Secondary | ICD-10-CM

## 2015-06-29 DIAGNOSIS — S46912A Strain of unspecified muscle, fascia and tendon at shoulder and upper arm level, left arm, initial encounter: Secondary | ICD-10-CM

## 2015-06-29 MED ORDER — DICLOFENAC SODIUM 75 MG OR TBEC
75.0000 mg | DELAYED_RELEASE_TABLET | Freq: Two times a day (BID) | ORAL | 0 refills | Status: DC
Start: 2015-06-29 — End: 2016-03-31
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
## 2015-07-05 ENCOUNTER — Ambulatory Visit (INDEPENDENT_AMBULATORY_CARE_PROVIDER_SITE_OTHER): Payer: 59

## 2015-07-05 DIAGNOSIS — S161XXD Strain of muscle, fascia and tendon at neck level, subsequent encounter: Secondary | ICD-10-CM

## 2015-07-05 DIAGNOSIS — S46912D Strain of unspecified muscle, fascia and tendon at shoulder and upper arm level, left arm, subsequent encounter: Secondary | ICD-10-CM

## 2015-07-05 NOTE — Progress Notes (Signed)
[facility]  Daily Treatment Note    Bo Merinondrew William Orea  Today's date: 07/05/2015  Referring Provider: Jodene NamAndre Zand    Time In: 7:56 Time Out: 9:10 Total Time: 74 minutes  Treatment Diagnosis:   Encounter Diagnoses   Name Primary?   . Cervical strain, subsequent encounter    . Strain of left shoulder, subsequent encounter          SUBJECTIVE:   Greig Castillandrew states that he is able to reach up higher. He's on anti inflammatory meds, which he is not sure how much it's helping.    OBJECTIVE:   AROM GH Flex 115  Abd 100    TREATMENT:   Today's treatment consisted of Manual Therapy x 15 minutes using soft tissue mobilizations and joint mobilizations focusing on the shoulder. Manual therapy performed to increase circulation, decrease tissue tightness, and reduce soreness. Therapeutic exercises x 29 minutes, along with neuromuscular re-education x 15 minutes as per flow sheet. Exercises performed to increase muscle strength, muscle coordination, movement quality, proprioception, exercise tolerance, and muscle endurance. Electrical stimulation with ice x 15 minutes was performed intensity to tolerance with an interferential setting to the shoulder to help reduce soreness and pain.      ASSESSMENT:   The patient tolerated today's appointment good/normal. Patient shows improved ROM today with less pain.    PLAN:   The current physical therapy regimen is effective;  continue present plan of care. Modify exercises and plan as needed.     Electrically signed,   Rolene CourseKayoko Sobey, MPT, COMT    [facility]

## 2015-07-09 ENCOUNTER — Ambulatory Visit (INDEPENDENT_AMBULATORY_CARE_PROVIDER_SITE_OTHER): Payer: 59

## 2015-07-09 DIAGNOSIS — S46912D Strain of unspecified muscle, fascia and tendon at shoulder and upper arm level, left arm, subsequent encounter: Secondary | ICD-10-CM

## 2015-07-09 DIAGNOSIS — S161XXD Strain of muscle, fascia and tendon at neck level, subsequent encounter: Secondary | ICD-10-CM

## 2015-07-09 NOTE — Progress Notes (Signed)
[facility]  Daily Treatment Note    Bo Merinondrew William Bambrick  Today's date: 07/09/2015  Referring Provider: Jodene NamAndre Zand    Time In: 8:58 Time Out: 10:20 Total Time: 82 minutes  Treatment Diagnosis:   Encounter Diagnoses   Name Primary?   . Cervical strain, subsequent encounter    . Strain of left shoulder, subsequent encounter          SUBJECTIVE:   William Castillandrew states that he is feeling better with his neck, but still painful to turn to left all the way.    OBJECTIVE:   AROM C/S L. Rot pain 80%    TREATMENT:   Today's treatment consisted of Manual Therapy x 20 minutes using soft tissue mobilizations and joint mobilizations focusing on the C/S and shoulder. Manual therapy performed to increase circulation, decrease tissue tightness, and reduce soreness. Therapeutic exercises x 32 minutes, along with neuromuscular re-education x 15 minutes as per flow sheet. Exercises performed to increase muscle strength, muscle coordination, movement quality, proprioception, exercise tolerance, and muscle endurance. Electrical stimulation with ice x 15 minutes was performed intensity to tolerance with an interferential setting to the shoulder to help reduce soreness and pain.      ASSESSMENT:   The patient tolerated today's appointment good/normal. Patient shows improved ROM post AA joint mob.    PLAN:   The current physical therapy regimen is effective;  continue present plan of care. Modify exercises and plan as needed.     Electrically signed,   Rolene CourseKayoko Sobey, MPT, COMT    [facility]

## 2015-07-12 ENCOUNTER — Ambulatory Visit (INDEPENDENT_AMBULATORY_CARE_PROVIDER_SITE_OTHER): Payer: 59

## 2015-07-12 DIAGNOSIS — S161XXD Strain of muscle, fascia and tendon at neck level, subsequent encounter: Secondary | ICD-10-CM

## 2015-07-12 DIAGNOSIS — S46912D Strain of unspecified muscle, fascia and tendon at shoulder and upper arm level, left arm, subsequent encounter: Secondary | ICD-10-CM

## 2015-07-12 NOTE — Progress Notes (Signed)
[facility]  Physical Therapy Progress Note    Bo Merinondrew William Brandenberger  Today's date: 07/12/2015  Referring Provider: Jodene NamAndre Zand    Time In: 9:30 Time Out: 10:50 Total Time: 80 minutes  Treatment Diagnosis:   Encounter Diagnoses   Name Primary?   . Cervical strain, subsequent encounter    . Strain of left shoulder, subsequent encounter        Number of visits (including today's visit and initial evaluation): 8    SUBJECTIVE:   William Bryant has been seen at our office for the above diagnosis(s). The patient currently reports 4/10 pain described as achy. He feels that he's getting more ROM and less pain, but still unable to reach all the way up to the high shelf.    OBJECTIVE:  AROM:       Comment   Flexion 0-32      Extension 0-45    Left Side Bend 0-29    Right Side Bend 0-24    Left Rotation 0-65 Min p   Right Rotation 0-65    Shoulder abd 100    Shoulder flex 110    Shoulder HBB L1             PROM:     Comment   Flexion 0-35    Extension 0-50    Left Side Bend 0-32    Right Side Bend 0-25    Left Rotation 0-67    Right Rotation 0-67             MMT: Manual Muscle Test for Cervical Spine: (Values scored on a 0-5 scale)     Right side  Left Side Comment   Cervical Flexion  4/5     Cervical Extension  4/5     Cervical Rotation 4-/5  4/5    Cervical Side Bend 4-/5  4/5    Shoulder abduction 5/5  3+/5    Shoulder ER 5/5  4-/5    Shoulder flexion 5//5  4-/5        PALPATION: Min tenderness on T1-2, levator, upper trap, teres, facet Left C4-6    NEUROLOGY:   Reflexes: WFL      Sensory Testing:   Right Left Comment   C1 normal  normal     C2 normal  normal    C3 normal  normal    C4 normal  Increased pressure    C5 normal  Increased pressure    C6 normal  Increased pressure    C7 normal  Increased pressure    C8 normal  normal    T1 normal  normal    T2 normal  normal        Myotome Testing:   Right Left  Comment   C1 WNL WNL    C2 WNL WNL    C3 4-/5 WNL    C4 WNL WNL    C5 WNL 3+/5    C6 WNL 4-/5    C7 WNL WNL    C8 WNL 4-/5    T1 WNL WNL      DASH:   The patient self scores 75/180; 30/180 = Full function without difficulty    SPECIAL TESTING:  Cervical shear (+) C4/5      JOINT PLAY:   C4/5 Hyermobility Gr.4    IMAGING REVIEW:   X-ray- no acute findings      TREATMENT:   Today's appointment consisted of the re-evaluation. The patient was educated regarding physical therapy  progression and current findings. Treatment then consisted of Today's treatment consisted of Manual Therapy x 15 minutes using soft tissue mobilizations and joint mobilizations focusing on the shoulder and C/S. Manual therapy performed to increase circulation, decrease tissue tightness, and reduce soreness. Therapeutic exercises x 35 minutes, along with neuromuscular re-education x 15 minutes as per flow sheet. Exercises performed to increase muscle strength, muscle coordination, movement quality, proprioception, exercise tolerance, and muscle endurance. Electrical stimulation with ice x 15 minutes was performed intensity to tolerance with an interferential setting to the shoulder to help reduce soreness and pain.  .    ASSESSMENT:   William Bryant has improved with ROM, shoulder and C/S stability as well as . Skilled physical therapy is still appropriate. The patient is still presenting with the following impairments / functional limitations:   1)Reach forward with 3/10 pain. (Previous:Reach forward with 6/10 pain level)   2)Reach into his back pocket with 2/10 pain. (Previous:Reach back pocket with 4/10 pain)      Their progress on their short-term / long-term goals: Good.    Short Term Goals: In the next 2-4 weeks, it is expected that the patient will be able to:                        1) Reach forward and with 2/10 pain                         2) Reach into his back pocket with no pain    Long Term Goals: By the end of physical  therapy, it is expected that the patient will:                        1) be independent with their HEP                        2) self score 30/180 on the DASH      PLAN:   Physical therapy will continue at 1-2 visits per week, for an additional 6-8 weeks. We will continue to treat the patient using Manual therapy, Therapeutic exercise, Neuromuscular re-education, Functional activity training, Recreational activity progression,modification of HEP, and modalities as needed.        Thank you Jodene Namndre Zand for the continued care of your patient, Bo Merinondrew William Kriegel. If you have any questions or concerns regarding their plan of care or this report, please contact me at the office.     Electrically signed,   Rolene CourseKayoko Sobey, MPT, COMT    [facility]

## 2015-07-13 ENCOUNTER — Encounter (INDEPENDENT_AMBULATORY_CARE_PROVIDER_SITE_OTHER): Payer: Self-pay | Admitting: Family Medicine

## 2015-07-13 ENCOUNTER — Ambulatory Visit (INDEPENDENT_AMBULATORY_CARE_PROVIDER_SITE_OTHER): Payer: 59 | Admitting: Family Medicine

## 2015-07-13 VITALS — BP 138/62 | HR 89 | Ht 71.0 in | Wt 220.0 lb

## 2015-07-13 DIAGNOSIS — S161XXD Strain of muscle, fascia and tendon at neck level, subsequent encounter: Secondary | ICD-10-CM

## 2015-07-13 DIAGNOSIS — Z6830 Body mass index (BMI) 30.0-30.9, adult: Secondary | ICD-10-CM

## 2015-07-13 DIAGNOSIS — S46912D Strain of unspecified muscle, fascia and tendon at shoulder and upper arm level, left arm, subsequent encounter: Secondary | ICD-10-CM

## 2015-07-13 NOTE — Progress Notes (Signed)
Chief Complaint   Patient presents with   . Shoulder Pain     Recheck neck and R shoulder. Some improvement with both neck and R shoulder. Has been going to [facility].                            Bo MerinoAndrew William Hassing returns for followup of (S16.1XXD) Cervical strain, subsequent encounter  (primary encounter diagnosis)  Plan: sore working in front of computer, work related    [phone](S46.912D) Strain of left shoulder, subsequent encounter  Plan: pain medial scapular border improved.  Pain also lateral lat region, pain to lift shoulder above 100 degrees.  .       Interval treatment or imaging: PT, stopped diclofenac    Current Outpatient Prescriptions   Medication Sig Dispense Refill   . Aspirin 81 MG Oral Chew Tab Take 81 mg by mouth every day.     . Benazepril HCl 5 MG Oral Tab Take 1 tablet (5 mg) by mouth daily. Take 1 Tab by mouth daily at bedtime. To prevent kidney damage. 90 tablet 2   . Cholecalciferol (D 2000) 2000 UNITS Oral Tab Take 1 Tab by mouth every day.     . Diclofenac Sodium 75 MG Oral Tab EC Take 1 tablet (75 mg) by mouth 2 times a day. (Patient not taking: Reported on 07/13/2015) 60 tablet 0   . Glimepiride 2 MG Oral Tab Take 0.5 tablets (1 mg) by mouth 2 times a day. Take before breakfast & dinner. 90 tablet 2   . MetFORMIN HCl 1000 MG Oral Tab TAKE ONE TABLET BY MOUTH TWICE DAILY WITH LARGEST MEALS. 180 tablet 2   . Simvastatin 20 MG Oral Tab Take 1 tablet (20 mg) by mouth daily. 90 tablet 2     No current facility-administered medications for this visit.          14 point review of systems is reviewed and unchanged from last visit.  Noncontributory to CC.    BP 138/62   Pulse 89   Ht 5\' 11"  (1.803 m)   Wt (!) 220 lb (99.8 kg)   SpO2 95%   BMI 30.68 kg/m     EXAM:   FROM neck and shoulder  Pain left shoulder with resisted ABD at 90  RC exam otherwise normal  TP left medial scapular angle and axillary lateral lat  No pain scap retraction  Shoulder pain with  horizontal extension    IMPRESSIONS:  (S16.1XXD) Cervical strain, subsequent encounter  (primary encounter diagnosis)  Plan:     (I69.629B(S46.912D) Strain of left shoulder, subsequent encounter  Plan:     Improved  Offered TPI  Will continue course with PT  Recheck 4 weeks      MDS

## 2015-07-18 ENCOUNTER — Telehealth (INDEPENDENT_AMBULATORY_CARE_PROVIDER_SITE_OTHER): Payer: Self-pay

## 2015-07-18 DIAGNOSIS — E118 Type 2 diabetes mellitus with unspecified complications: Secondary | ICD-10-CM

## 2015-07-18 DIAGNOSIS — E119 Type 2 diabetes mellitus without complications: Secondary | ICD-10-CM

## 2015-07-18 DIAGNOSIS — I10 Essential (primary) hypertension: Secondary | ICD-10-CM

## 2015-07-18 DIAGNOSIS — E782 Mixed hyperlipidemia: Secondary | ICD-10-CM

## 2015-07-18 MED ORDER — METFORMIN HCL 1000 MG OR TABS
ORAL_TABLET | ORAL | 6 refills | Status: DC
Start: 2015-07-18 — End: 2016-03-24

## 2015-07-18 MED ORDER — SIMVASTATIN 20 MG OR TABS
20.0000 mg | ORAL_TABLET | Freq: Every day | ORAL | 6 refills | Status: DC
Start: 2015-07-18 — End: 2016-05-19

## 2015-07-18 MED ORDER — BENAZEPRIL HCL 5 MG OR TABS
5.0000 mg | ORAL_TABLET | Freq: Every day | ORAL | 6 refills | Status: DC
Start: 2015-07-18 — End: 2016-03-13

## 2015-07-18 MED ORDER — GLIMEPIRIDE 2 MG OR TABS
2.0000 mg | ORAL_TABLET | Freq: Every day | ORAL | 6 refills | Status: DC
Start: 2015-07-18 — End: 2016-03-06

## 2015-07-18 NOTE — Telephone Encounter (Signed)
Pt notified and corrected dose of glimepiride.

## 2015-07-18 NOTE — Telephone Encounter (Signed)
[phone] Patient would like his meds refilled but needs some clarification regarding the Glimepiride beforre being refilled please.  Thank you     Glimepiride 2 MG Oral Tab   MetFORMIN HCl 1000 MG Oral Tab  Benazepril HCl 5 MG Oral Tab  Simvastatin 20 MG Oral Tab    [facility] [place] [phone]

## 2015-07-24 ENCOUNTER — Encounter (INDEPENDENT_AMBULATORY_CARE_PROVIDER_SITE_OTHER): Payer: 59

## 2015-07-27 ENCOUNTER — Encounter (INDEPENDENT_AMBULATORY_CARE_PROVIDER_SITE_OTHER): Payer: 59

## 2015-07-31 ENCOUNTER — Ambulatory Visit (INDEPENDENT_AMBULATORY_CARE_PROVIDER_SITE_OTHER): Payer: 59

## 2015-07-31 DIAGNOSIS — S161XXD Strain of muscle, fascia and tendon at neck level, subsequent encounter: Secondary | ICD-10-CM

## 2015-07-31 DIAGNOSIS — S46912D Strain of unspecified muscle, fascia and tendon at shoulder and upper arm level, left arm, subsequent encounter: Secondary | ICD-10-CM

## 2015-07-31 NOTE — Progress Notes (Signed)
[facility]  Daily Treatment Note    William Bryant  Today's date: 07/31/2015  Referring Provider: Jodene NamAndre Zand    Time In: 1:20 Time Out: 2:43 Total Time: 83 minutes  Treatment Diagnosis:   Encounter Diagnoses   Name Primary?   . Cervical strain, subsequent encounter    . Strain of left shoulder, subsequent encounter          SUBJECTIVE:   William Bryant states that his C/S pain is improving, but the pain at the shoulder joint is still there.    OBJECTIVE:   TTP on biceps tendon at the groove    TREATMENT:   Today's treatment consisted of Manual Therapy x 20 minutes using soft tissue mobilizations and joint mobilizations focusing on the shoulder. Manual therapy performed to increase circulation, decrease tissue tightness, and reduce soreness. Therapeutic exercises x 33 minutes, along with neuromuscular re-education x 15 minutes as per flow sheet. Exercises performed to increase muscle strength, muscle coordination, movement quality, proprioception, exercise tolerance, and muscle endurance. Electrical stimulation with ice x 15 minutes was performed intensity to tolerance with an interferential setting to the shoulder to help reduce soreness and pain.      ASSESSMENT:   The patient tolerated today's appointment good/normal. Patient shows improved upper trap tone.    PLAN:   The current physical therapy regimen is effective;  continue present plan of care. Modify exercises and plan as needed.     Electrically signed,   Rolene CourseKayoko Sobey, MPT, COMT    [facility]

## 2015-08-02 ENCOUNTER — Ambulatory Visit (INDEPENDENT_AMBULATORY_CARE_PROVIDER_SITE_OTHER): Payer: 59

## 2015-08-02 DIAGNOSIS — S46912D Strain of unspecified muscle, fascia and tendon at shoulder and upper arm level, left arm, subsequent encounter: Secondary | ICD-10-CM

## 2015-08-02 DIAGNOSIS — S161XXD Strain of muscle, fascia and tendon at neck level, subsequent encounter: Secondary | ICD-10-CM

## 2015-08-02 NOTE — Progress Notes (Signed)
[facility]  Daily Treatment Note    William Bryant  Today's date: 08/02/2015  Referring Provider: Jodene NamAndre Zand    Time In: 9:21 Time Out: 10:40 Total Time: 79 minutes  Treatment Diagnosis:   Encounter Diagnoses   Name Primary?   . Cervical strain, subsequent encounter    . Strain of left shoulder, subsequent encounter          SUBJECTIVE:   William Bryant states that he was working at home, and when he reached upwards, he felt more pain on his shoulder. Today his upper trap is really sore.    OBJECTIVE:   AROM Hor abduction pain past 90    TREATMENT:   Today's treatment consisted of Manual Therapy x 19 minutes using soft tissue mobilizations and joint mobilizations focusing on the C/S and shoulder. Manual therapy performed to increase circulation, decrease tissue tightness, and reduce soreness. Therapeutic exercises x 30 minutes, along with neuromuscular re-education x 15 minutes as per flow sheet. Exercises performed to increase muscle strength, muscle coordination, movement quality, proprioception, exercise tolerance, and muscle endurance. Electrical stimulation with ice x 15 minutes was performed intensity to tolerance with an interferential setting to the shoulder to help reduce soreness and pain.      ASSESSMENT:   The patient tolerated today's appointment good/normal. Patient shows limited upper trap flexibility today.    PLAN:   The current physical therapy regimen is effective;  continue present plan of care. Modify exercises and plan as needed.     Electrically signed,   Rolene CourseKayoko Sobey, MPT, COMT    [facility]

## 2015-08-06 ENCOUNTER — Ambulatory Visit (INDEPENDENT_AMBULATORY_CARE_PROVIDER_SITE_OTHER): Payer: 59

## 2015-08-06 DIAGNOSIS — S46912D Strain of unspecified muscle, fascia and tendon at shoulder and upper arm level, left arm, subsequent encounter: Secondary | ICD-10-CM

## 2015-08-06 DIAGNOSIS — S161XXD Strain of muscle, fascia and tendon at neck level, subsequent encounter: Secondary | ICD-10-CM

## 2015-08-06 NOTE — Progress Notes (Signed)
[facility]  Daily Treatment Note    William Bryant  Today's date: 08/06/2015  Referring Provider: Jodene Nam    Time In: 9:25 Time Out: 10:50 Total Time: 85 minutes  Treatment Diagnosis:   Encounter Diagnoses   Name Primary?   . Cervical strain, subsequent encounter    . Strain of left shoulder, subsequent encounter          SUBJECTIVE:   William Bryant states that he continues to feel pain more focused on his shoulder joint rather than upper trap. He was unable to reach to his back of his head due to pain.    OBJECTIVE:   AROM ER at 90 abduction- 65    TREATMENT:   Today's treatment consisted of Manual Therapy x 20 minutes using soft tissue mobilizations and joint mobilizations focusing on the shoulder. Manual therapy performed to increase circulation, decrease tissue tightness, and reduce soreness. Therapeutic exercises x 35 minutes, along with neuromuscular re-education x 15 minutes as per flow sheet. Exercises performed to increase muscle strength, muscle coordination, movement quality, proprioception, exercise tolerance, and muscle endurance. Electrical stimulation with ice x 15 minutes was performed intensity to tolerance with an interferential setting to the shoulder to help reduce soreness and pain.      ASSESSMENT:   The patient tolerated today's appointment good/normal. Patient shows limited ROM into ER or HBN. ABle to reach farther with PROM.    PLAN:   The current physical therapy regimen is effective;  continue present plan of care. Modify exercises and plan as needed.     Electrically signed,   Rolene Course, MPT, COMT    [facility]

## 2015-08-09 ENCOUNTER — Ambulatory Visit (INDEPENDENT_AMBULATORY_CARE_PROVIDER_SITE_OTHER): Payer: 59

## 2015-08-09 DIAGNOSIS — S46912D Strain of unspecified muscle, fascia and tendon at shoulder and upper arm level, left arm, subsequent encounter: Secondary | ICD-10-CM

## 2015-08-09 DIAGNOSIS — S161XXD Strain of muscle, fascia and tendon at neck level, subsequent encounter: Secondary | ICD-10-CM

## 2015-08-09 NOTE — Progress Notes (Signed)
[facility]  Physical Therapy Progress Note    William Bryant  Today's date: 08/09/2015  Referring Provider: Jodene Nam    Time In: 9:22 Time Out: 10:42 Total Time: 80 minutes  Treatment Diagnosis:   Encounter Diagnoses   Name Primary?   . Cervical strain, subsequent encounter    . Strain of left shoulder, subsequent encounter        Number of visits (including today's visit and initial evaluation): 12    SUBJECTIVE:   William Bryant has been seen at our office for the above diagnosis(s). The patient currently reports 4-5/10 pain described as achy and some sharp pain with reaching back and up motion. General pain has reduced and overall ROM is definitely improving, but pain in his shoulder remains.     OBJECTIVE:  AROM:       Comment   Flexion 0-35    Extension 0-45    Left Side Bend 0-29    Right Side Bend 0-26    Left Rotation 0-65    Right Rotation 0-65    Shoulder abd 155    Shoulder flex 150    Shoulder HBB L1             PROM:     Comment   Flexion 0-35    Extension 0-50    Left Side Bend 0-32    Right Side Bend 0-25    Left Rotation 0-67    Right Rotation 0-67             MMT: Manual Muscle Test for Cervical Spine: (Values scored on a 0-5 scale)     Right side  Left Side Comment   Cervical Flexion  4/5     Cervical Extension  4/5     Cervical Rotation 4-/5  4/5    Cervical Side Bend 4-/5  4/5    Shoulder abduction 5/5  4-/5    Shoulder ER 5/5  4-/5    Shoulder flexion 5//5  4/5        PALPATION: Min tenderness on T1-2, levator, upper trap, teres, facet Left C4-6    NEUROLOGY:   Reflexes: WFL     Sensory Testing:   Right Left Comment   C1 normal  normal     C2 normal  normal    C3 normal  normal    C4 normal  Increased pressure    C5 normal  Increased pressure    C6 normal  normal    C7 normal  normal    C8 normal  normal    T1 normal  normal    T2 normal  normal        Myotome Testing:    Right Left Comment   C1 WNL WNL    C2 WNL WNL    C3 4-/5 WNL    C4 WNL WNL    C5 WNL 4-/5    C6 WNL 4-/5    C7 WNL WNL    C8 WNL 4-/5    T1 WNL WNL      DASH:   The patient self scores 73/180; 30/180 = Full function without difficulty    SPECIAL TESTING:  Cervical shear (+) C4/5  O'Brien(+)      JOINT PLAY:   C4/5 Hyermobility Gr.4    IMAGING REVIEW:   X-ray- no acute findings        TREATMENT:   Today's appointment consisted of the re-evaluation. The patient was educated regarding  physical therapy progression and current findings. Treatment then consisted of Today's treatment consisted of Manual Therapy x 20 minutes using soft tissue mobilizations and joint mobilizations focusing on the shoulder. Manual therapy performed to increase circulation, decrease tissue tightness, and reduce soreness. Therapeutic exercises x 30 minutes, along with neuromuscular re-education x 15 minutes as per flow sheet. Exercises performed to increase muscle strength, muscle coordination, movement quality, proprioception, exercise tolerance, and muscle endurance. Electrical stimulation with ice x 15 minutes was performed intensity to tolerance with an interferential setting to the shoulder to help reduce soreness and pain.  .    ASSESSMENT:   William Bryant has improved with ROM, decreased tenderness on shoulder as well as improved shoulder stability. He's making a progress for his shoulder, however he continues to have pain on his shoulder, that signs and symptoms consistent with possible labral tear, which was difficult to recognize initially with all the pain the patient had.  Skilled physical therapy is still appropriate. The patient is still presenting with the following impairments / functional limitations:   1)Reach forward with 3/10 pain. (Previous:Reach forward with 6/10 pain level)   2)Reach into his back pocket with 2/10 pain. (Previous:Reach back pocket with 4/10 pain)      Their progress on their short-term / long-term  goals: Good.    Short Term Goals: In the next 2-4 weeks, it is expected that the patient will be able to:  1) Reach forward and with 2/10 pain   2) Reach into his back pocket with no pain    Long Term Goals: By the end of physical therapy, it is expected that the patient will:  1) be independent with their HEP  2) self score 30/180on the DASH    PLAN:   Physical therapy will continue at 2 visits per week, for an additional 6-8 weeks. We will continue to treat the patient using Manual therapy, Therapeutic exercise, Neuromuscular re-education, Functional activity training, Recreational activity progression,modification of HEP, and modalities as needed.        Thank you Jodene Nam for the continued care of your patient, William Bryant. If you have any questions or concerns regarding their plan of care or this report, please contact me at the office.     Electrically signed,   Rolene Course, MPT, COMT    [facility]

## 2015-08-10 ENCOUNTER — Encounter (INDEPENDENT_AMBULATORY_CARE_PROVIDER_SITE_OTHER): Payer: No Typology Code available for payment source | Admitting: Family Medicine

## 2015-08-11 ENCOUNTER — Ambulatory Visit (INDEPENDENT_AMBULATORY_CARE_PROVIDER_SITE_OTHER): Payer: 59 | Admitting: Family Medicine

## 2015-08-11 ENCOUNTER — Encounter (INDEPENDENT_AMBULATORY_CARE_PROVIDER_SITE_OTHER): Payer: Self-pay | Admitting: Family Medicine

## 2015-08-11 ENCOUNTER — Telehealth: Payer: Self-pay

## 2015-08-11 VITALS — BP 128/64 | HR 86 | Ht 71.0 in | Wt 220.0 lb

## 2015-08-11 DIAGNOSIS — Z6830 Body mass index (BMI) 30.0-30.9, adult: Secondary | ICD-10-CM

## 2015-08-11 DIAGNOSIS — S161XXD Strain of muscle, fascia and tendon at neck level, subsequent encounter: Secondary | ICD-10-CM

## 2015-08-11 DIAGNOSIS — S46912D Strain of unspecified muscle, fascia and tendon at shoulder and upper arm level, left arm, subsequent encounter: Secondary | ICD-10-CM
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
## 2015-08-11 NOTE — Telephone Encounter (Signed)
Called patient to inform them of their health maintenance/care gap needs. Patient was unavailable.

## 2015-08-14 ENCOUNTER — Ambulatory Visit (INDEPENDENT_AMBULATORY_CARE_PROVIDER_SITE_OTHER): Payer: 59

## 2015-08-14 DIAGNOSIS — S161XXD Strain of muscle, fascia and tendon at neck level, subsequent encounter: Secondary | ICD-10-CM

## 2015-08-14 DIAGNOSIS — S46912D Strain of unspecified muscle, fascia and tendon at shoulder and upper arm level, left arm, subsequent encounter: Secondary | ICD-10-CM

## 2015-08-14 NOTE — Progress Notes (Signed)
[facility]  Daily Treatment Note    Erland Nagar  Today's date: 08/14/2015  Referring Provider: Jodene Nam    Time In: 3:57 Time Out: 5:15 Total Time: 78 minutes  Treatment Diagnosis:   Encounter Diagnoses   Name Primary?   . Cervical strain, subsequent encounter    . Strain of left shoulder, subsequent encounter          SUBJECTIVE:   William Bryant states that he is feeling a little better overall.    OBJECTIVE:   AROM GH Flex 155    TREATMENT:   Today's treatment consisted of Manual Therapy x 15 minutes using soft tissue mobilizations and joint mobilizations focusing on the shoulder. Manual therapy performed to increase circulation, decrease tissue tightness, and reduce soreness. Therapeutic exercises x 33 minutes, along with neuromuscular re-education x 15 minutes as per flow sheet. Exercises performed to increase muscle strength, muscle coordination, movement quality, proprioception, exercise tolerance, and muscle endurance. Electrical stimulation with ice x 15 minutes was performed intensity to tolerance with an interferential setting to the shoulder to help reduce soreness and pain.      ASSESSMENT:   The patient tolerated today's appointment good/normal. He shows improved shoulder stability and ROM overall.    PLAN:   The current physical therapy regimen is effective;  continue present plan of care. Modify exercises and plan as needed.     Electrically signed,   Rolene Course, MPT, COMT    [facility]

## 2015-08-16 ENCOUNTER — Ambulatory Visit (INDEPENDENT_AMBULATORY_CARE_PROVIDER_SITE_OTHER): Payer: 59

## 2015-08-16 DIAGNOSIS — S161XXD Strain of muscle, fascia and tendon at neck level, subsequent encounter: Secondary | ICD-10-CM

## 2015-08-16 DIAGNOSIS — S46912D Strain of unspecified muscle, fascia and tendon at shoulder and upper arm level, left arm, subsequent encounter: Secondary | ICD-10-CM

## 2015-08-16 NOTE — Progress Notes (Signed)
[facility]  Daily Treatment Note    William Bryant  Today's date: 08/16/2015  Referring Provider: Jodene Nam    Time In: 8:55 Time Out: 10:19 Total Time: 84 minutes  Treatment Diagnosis:   Encounter Diagnoses   Name Primary?   . Cervical strain, subsequent encounter    . Strain of left shoulder, subsequent encounter          SUBJECTIVE:   William Bryant states that he had an appointment with MD for recheck, and wants him to continue with PT for another 6 weeks.    OBJECTIVE:   TTP on rhomboids    TREATMENT:   Today's treatment consisted of Manual Therapy x 20 minutes using soft tissue mobilizations and joint mobilizations focusing on the shoulder and C/S. Manual therapy performed to increase circulation, decrease tissue tightness, and reduce soreness. Therapeutic exercises x 34 minutes, along with neuromuscular re-education x 15 minutes as per flow sheet. Exercises performed to increase muscle strength, muscle coordination, movement quality, proprioception, exercise tolerance, and muscle endurance. Electrical stimulation with ice x 15 minutes was performed intensity to tolerance with an interferential setting to the shoulder to help reduce soreness and pain.      ASSESSMENT:   The patient tolerated today's appointment good/normal. He is slowly improving his ROM. Limited post glide and pain increase.     PLAN:   The current physical therapy regimen is effective;  continue present plan of care. Modify exercises and plan as needed.     Electrically signed,   Rolene Course, MPT, COMT    [facility]

## 2015-08-20 ENCOUNTER — Ambulatory Visit (INDEPENDENT_AMBULATORY_CARE_PROVIDER_SITE_OTHER): Payer: 59

## 2015-08-20 DIAGNOSIS — S46912D Strain of unspecified muscle, fascia and tendon at shoulder and upper arm level, left arm, subsequent encounter: Secondary | ICD-10-CM

## 2015-08-20 DIAGNOSIS — S161XXD Strain of muscle, fascia and tendon at neck level, subsequent encounter: Secondary | ICD-10-CM

## 2015-08-20 NOTE — Progress Notes (Signed)
[facility]  Daily Treatment Note    Bo Merinondrew William Behrens  Today's date: 08/20/2015  Referring Provider: Jodene NamAndre Zand    Time In: 10:27 Time Out: 11:50 Total Time: 83 minutes  Treatment Diagnosis:   Encounter Diagnoses   Name Primary?   . Cervical strain, subsequent encounter    . Strain of left shoulder, subsequent encounter          SUBJECTIVE:   William Castillandrew states that he continues to have pain on his shoulder.     OBJECTIVE:   TTP on upper trap, limited AROM    TREATMENT:   Today's treatment consisted of Manual Therapy x 15 minutes using soft tissue mobilizations and joint mobilizations focusing on the shoulder. Manual therapy performed to increase circulation, decrease tissue tightness, and reduce soreness. Therapeutic exercises x 35 minutes, along with neuromuscular re-education x 18 minutes as per flow sheet. Exercises performed to increase muscle strength, muscle coordination, movement quality, proprioception, exercise tolerance, and muscle endurance. Electrical stimulation with ice x 15 minutes was performed intensity to tolerance with an interferential setting to the shoulder to help reduce soreness and pain.      ASSESSMENT:   The patient tolerated today's appointment good/normal. He shows limited ROM with add/abd exercise today.    PLAN:   The current physical therapy regimen is effective;  continue present plan of care. Modify exercises and plan as needed.     Electrically signed,   Rolene CourseKayoko Sobey, MPT, COMT    [facility]

## 2015-08-28 ENCOUNTER — Ambulatory Visit (INDEPENDENT_AMBULATORY_CARE_PROVIDER_SITE_OTHER): Payer: 59

## 2015-08-28 DIAGNOSIS — S46912D Strain of unspecified muscle, fascia and tendon at shoulder and upper arm level, left arm, subsequent encounter: Secondary | ICD-10-CM

## 2015-08-28 DIAGNOSIS — S161XXD Strain of muscle, fascia and tendon at neck level, subsequent encounter: Secondary | ICD-10-CM

## 2015-08-28 NOTE — Progress Notes (Signed)
[facility]  Daily Treatment Note    William Bryant William Bryant  Today's date: 08/28/2015  Referring Provider: Jodene NamAndre Zand    Time In: 9:28 Time Out: 10:55 Total Time: 87 minutes  Treatment Diagnosis:   Encounter Diagnoses   Name Primary?   . Cervical strain, subsequent encounter    . Strain of left shoulder, subsequent encounter          SUBJECTIVE:   William Bryant states that he continues to have pain on his shoulder. Especially reaching out to the side and up or back.     OBJECTIVE:   Moderate TTP on pec major    TREATMENT:   Today's treatment consisted of Manual Therapy x 17minutes using soft tissue mobilizations and joint mobilizations focusing on the shoulder and pecs. Manual therapy performed to increase circulation, decrease tissue tightness, and reduce soreness. Therapeutic exercises x 35 minutes, along with neuromuscular re-education x 20 minutes as per flow sheet. Exercises performed to increase muscle strength, muscle coordination, movement quality, proprioception, exercise tolerance, and muscle endurance. Electrical stimulation with ice x 15 minutes was performed intensity to tolerance with an interferential setting to the shoulder to help reduce soreness and pain.      ASSESSMENT:   The patient tolerated today's appointment good/normal. He has very limited pec and rib mobility. Improved with STM and joint mob.    PLAN:   The current physical therapy regimen is effective;  continue present plan of care. Modify exercises and plan as needed.     Electrically signed,   Rolene CourseKayoko Sobey, MPT, COMT    [facility]

## 2015-08-30 ENCOUNTER — Encounter (INDEPENDENT_AMBULATORY_CARE_PROVIDER_SITE_OTHER): Payer: No Typology Code available for payment source

## 2015-08-30 ENCOUNTER — Ambulatory Visit (INDEPENDENT_AMBULATORY_CARE_PROVIDER_SITE_OTHER): Payer: 59

## 2015-08-30 DIAGNOSIS — S46912D Strain of unspecified muscle, fascia and tendon at shoulder and upper arm level, left arm, subsequent encounter: Secondary | ICD-10-CM

## 2015-08-30 DIAGNOSIS — S161XXD Strain of muscle, fascia and tendon at neck level, subsequent encounter: Secondary | ICD-10-CM

## 2015-08-30 NOTE — Progress Notes (Signed)
[facility]  Daily Treatment Note    Bo Merinondrew William Locklin  Today's date: 08/30/2015  Referring Provider: Jodene NamAndre Zand    Time In: 2:58 Time Out: 4:20 Total Time: 82 minutes  Treatment Diagnosis:   Encounter Diagnoses   Name Primary?   . Cervical strain, subsequent encounter    . Strain of left shoulder, subsequent encounter          SUBJECTIVE:   William Castillandrew states that he is feeling better into flexion, but abduction remains painful and difficult. His pec muscle is still sore.     OBJECTIVE:   Moderate increase in tone on pecs today.    TREATMENT:   Today's treatment consisted of Manual Therapy x 20 minutes using soft tissue mobilizations and joint mobilizations focusing on the shoulder. Manual therapy performed to increase circulation, decrease tissue tightness, and reduce soreness. Therapeutic exercises x 32 minutes, along with neuromuscular re-education x 15 minutes as per flow sheet. Exercises performed to increase muscle strength, muscle coordination, movement quality, proprioception, exercise tolerance, and muscle endurance. Electrical stimulation with ice x 15 minutes was performed intensity to tolerance with an interferential setting to the shoulder to help reduce soreness and pain.      ASSESSMENT:   The patient tolerated today's appointment good/normal. He continues to be limited in abduction control.    PLAN:   The current physical therapy regimen is effective;  continue present plan of care. Modify exercises and plan as needed.     Electrically signed,   Rolene CourseKayoko Sobey, MPT, COMT    [facility]

## 2015-09-10 ENCOUNTER — Encounter (INDEPENDENT_AMBULATORY_CARE_PROVIDER_SITE_OTHER): Payer: 59

## 2015-09-18 ENCOUNTER — Ambulatory Visit (INDEPENDENT_AMBULATORY_CARE_PROVIDER_SITE_OTHER): Payer: 59

## 2015-09-18 DIAGNOSIS — S161XXD Strain of muscle, fascia and tendon at neck level, subsequent encounter: Secondary | ICD-10-CM

## 2015-09-18 DIAGNOSIS — S46912D Strain of unspecified muscle, fascia and tendon at shoulder and upper arm level, left arm, subsequent encounter: Secondary | ICD-10-CM

## 2015-09-18 NOTE — Progress Notes (Signed)
[facility]  Daily Treatment Note    Bo Merinondrew William Palazzola  Today's date: 09/18/2015  Referring Provider: Jodene NamAndre Zand    Time In: 3:08 Time Out: 4:25 Total Time: 77 minutes  Treatment Diagnosis:   Encounter Diagnoses   Name Primary?   . Cervical strain, subsequent encounter    . Strain of left shoulder, subsequent encounter          SUBJECTIVE:   William Castillandrew states that he continues to have pain reaching back sideway.    OBJECTIVE:   AROM pain with horizontal abduction past 80    TREATMENT:   Today's treatment consisted of Manual Therapy x 15 minutes using soft tissue mobilizations and joint mobilizations focusing on the shoulder. Manual therapy performed to increase circulation, decrease tissue tightness, and reduce soreness. Therapeutic exercises x 32 minutes, along with neuromuscular re-education x 15 minutes as per flow sheet. Exercises performed to increase muscle strength, muscle coordination, movement quality, proprioception, exercise tolerance, and muscle endurance. Electrical stimulation with ice x 15 minutes was performed intensity to tolerance with an interferential setting to the shoulder to help reduce soreness and pain.      ASSESSMENT:   The patient tolerated today's appointment good/normal. He shows limited shoulder mobility into post glide with some pain.    PLAN:   The current physical therapy regimen is effective;  continue present plan of care. Modify exercises and plan as needed. MD appointment on near future.    Electrically signed,   Rolene CourseKayoko Sobey, MPT, COMT    [facility]

## 2015-09-21 ENCOUNTER — Ambulatory Visit (INDEPENDENT_AMBULATORY_CARE_PROVIDER_SITE_OTHER): Payer: 59 | Admitting: Family Medicine

## 2015-09-21 ENCOUNTER — Ambulatory Visit (INDEPENDENT_AMBULATORY_CARE_PROVIDER_SITE_OTHER): Payer: 59

## 2015-09-21 ENCOUNTER — Encounter (INDEPENDENT_AMBULATORY_CARE_PROVIDER_SITE_OTHER): Payer: Self-pay | Admitting: Family Medicine

## 2015-09-21 VITALS — BP 120/68 | HR 86 | Ht 71.0 in | Wt 220.0 lb

## 2015-09-21 DIAGNOSIS — S46912D Strain of unspecified muscle, fascia and tendon at shoulder and upper arm level, left arm, subsequent encounter: Secondary | ICD-10-CM

## 2015-09-21 DIAGNOSIS — S161XXD Strain of muscle, fascia and tendon at neck level, subsequent encounter: Secondary | ICD-10-CM

## 2015-09-21 DIAGNOSIS — Z6830 Body mass index (BMI) 30.0-30.9, adult: Secondary | ICD-10-CM

## 2015-09-21 NOTE — Progress Notes (Signed)
[facility]  Daily Treatment Note    William Bryant  Today's date: 09/21/2015  Referring Provider: Jodene NamAndre Zand    Time In: 10:14 Time Out: 11:39 Total Time: 85 minutes  Treatment Diagnosis:   Encounter Diagnoses   Name Primary?   . Cervical strain, subsequent encounter    . Strain of left shoulder, subsequent encounter          SUBJECTIVE:   William Bryant states that he went to see MD today and was told to have MRI. He continues to have pain on his shoulder, but upper trap is feeling better overall.     OBJECTIVE:   AROM GH Horizontal abduction 80 pain    TREATMENT:   Today's treatment consisted of Manual Therapy x 20 minutes using soft tissue mobilizations and joint mobilizations focusing on the shoulder. Manual therapy performed to increase circulation, decrease tissue tightness, and reduce soreness. Therapeutic exercises x 35 minutes, along with neuromuscular re-education x 15 minutes as per flow sheet. Exercises performed to increase muscle strength, muscle coordination, movement quality, proprioception, exercise tolerance, and muscle endurance. Electrical stimulation with ice x 15 minutes was performed intensity to tolerance with an interferential setting to the shoulder to help reduce soreness and pain.      ASSESSMENT:   The patient tolerated today's appointment good/normal. He shows increased ROM post joint mob and STM.    PLAN:   The current physical therapy regimen is effective;  continue present plan of care. Modify exercises and plan as needed.     Electrically signed,   Rolene CourseKayoko Sobey, MPT, COMT    [facility]

## 2015-09-21 NOTE — Progress Notes (Signed)
09/21/2015    Chief Complaint   Patient presents with   . Neck Pain     Recheck neck and R shoulder. Neck is improving but still having issues with his R shoulder. Going to PT 2 times per week. Has finished 3 months of PT.                           William MerinoAndrew William Bryant returns for followup of (S16.1XXD) Cervical strain, subsequent encounter  (primary encounter diagnosis)  Plan: improved with intermittent left side soreness    (S46.912D) Strain of left shoulder, subsequent encounter  Plan: MRI SHOULDER W CONTRAST LEFT, XR SHOULDER         ARTHROGRAM COMPLETE LEFT        No progress, persistent posterior pain and limited ROM, keeping him awake  .         Current Outpatient Prescriptions   Medication Sig Dispense Refill   . Aspirin 81 MG Oral Chew Tab Take 81 mg by mouth every day.     . Benazepril HCl 5 MG Oral Tab Take 1 tablet (5 mg) by mouth daily. Take 1 Tab by mouth daily at bedtime. To prevent kidney damage. 30 tablet 6   . Cholecalciferol (D 2000) 2000 UNITS Oral Tab Take 1 Tab by mouth every day.     . Diclofenac Sodium 75 MG Oral Tab EC Take 1 tablet (75 mg) by mouth 2 times a day. (Patient not taking: Reported on 07/13/2015) 60 tablet 0   . Glimepiride 2 MG Oral Tab Take 1 tablet (2 mg) by mouth daily. 60 tablet 6   . Glimepiride 2 MG Oral Tab Take 0.5 tablets (1 mg) by mouth 2 times a day. Take before breakfast & dinner. 90 tablet 2   . MetFORMIN HCl 1000 MG Oral Tab TAKE ONE TABLET BY MOUTH TWICE DAILY WITH LARGEST MEALS. 60 tablet 6   . Simvastatin 20 MG Oral Tab Take 1 tablet (20 mg) by mouth daily. 30 tablet 6     No current facility-administered medications for this visit.          14 point review of systems is reviewed and unchanged from last visit.  Noncontributory to CC.    BP 120/68   Pulse 86   Ht 5\' 11"  (1.803 m)   Wt (!) 220 lb (99.8 kg)   SpO2 94%   BMI 30.68 kg/m     EXAM:   Neck ROM full flexion, extension, side bending, and rotation.  All motions pain free.  Spurling maneuver  negative bilateral.  No tenderness of the spinous processes or PVM's.  No tenderness of the trapezius, SCM, or scalenes.  No pain with axial compression.  No pain with muscle testing in the AP and lateral planes.  DTR's, sensation, and MMT of the upper extremity's intact and symmetric.    Left shoulder:  FLEX 130  ER 90 with pain  IR buttocks  Tender posterior joint line  No pain or instability with drive through  + crank and obrien  RC strength excellent    IMPRESSIONS:  (S16.1XXD) Cervical strain, subsequent encounter  (primary encounter diagnosis)  Plan:     (V78.469G(S46.912D) Strain of left shoulder, subsequent encounter  Plan: MRI SHOULDER W CONTRAST LEFT, XR SHOULDER         ARTHROGRAM COMPLETE LEFT        Worried about labrum    RTC to review results  MDS

## 2015-10-15 ENCOUNTER — Encounter: Payer: Self-pay | Admitting: Family Medicine

## 2015-10-15 ENCOUNTER — Ambulatory Visit (INDEPENDENT_AMBULATORY_CARE_PROVIDER_SITE_OTHER): Payer: No Typology Code available for payment source

## 2015-10-16 ENCOUNTER — Encounter: Payer: Self-pay | Admitting: Family Medicine

## 2015-10-19 ENCOUNTER — Ambulatory Visit (INDEPENDENT_AMBULATORY_CARE_PROVIDER_SITE_OTHER): Payer: 59 | Admitting: Family Medicine

## 2015-10-19 ENCOUNTER — Encounter (INDEPENDENT_AMBULATORY_CARE_PROVIDER_SITE_OTHER): Payer: Self-pay | Admitting: Family Medicine

## 2015-10-19 VITALS — BP 122/74 | HR 82 | Ht 71.0 in | Wt 220.0 lb

## 2015-10-19 DIAGNOSIS — M19012 Primary osteoarthritis, left shoulder: Secondary | ICD-10-CM

## 2015-10-19 DIAGNOSIS — Z6830 Body mass index (BMI) 30.0-30.9, adult: Secondary | ICD-10-CM

## 2015-10-19 DIAGNOSIS — S46912D Strain of unspecified muscle, fascia and tendon at shoulder and upper arm level, left arm, subsequent encounter: Secondary | ICD-10-CM

## 2015-10-19 NOTE — Progress Notes (Signed)
10/19/2015    FOLLOW UP MRI left shoulder    FINDINGS: Reviewed personally with patient today.  LEFT SHOULDER   IMPRESSION:   1. Partial-thickness articular-sided tearing of the distal   supraspinatus tendon and the anterior fibers of the distal   infraspinatus tendon.   2. Tearing of the superior and posterior superior labrum.   3. Moderate osteoarthritis of the AC joint.   4. Tendinosis of the intra-articular long head of biceps tendon.    Dictated and Verified by: Patrick NorthJennifer L Favinger MD, [facility] 10/15/2015 11:37      BP 122/74   Pulse 82   Ht 5\' 11"  (1.803 m)   Wt (!) 220 lb (99.8 kg)   SpO2 90%   BMI 30.68 kg/m     EXAM:   Very tender ACJ    DISCUSSION:  The findings explain the complaints and exam finding on physical exam and consistent with the history obtained.  I have review the report and images with the patient.  The various treatment options were discussed and all questions answered.    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    MDS

## 2015-10-26 ENCOUNTER — Ambulatory Visit (INDEPENDENT_AMBULATORY_CARE_PROVIDER_SITE_OTHER): Payer: 59

## 2015-10-26 DIAGNOSIS — S46912D Strain of unspecified muscle, fascia and tendon at shoulder and upper arm level, left arm, subsequent encounter: Secondary | ICD-10-CM

## 2015-10-26 DIAGNOSIS — S161XXD Strain of muscle, fascia and tendon at neck level, subsequent encounter: Secondary | ICD-10-CM

## 2015-10-26 NOTE — Progress Notes (Signed)
[facility]  Daily Treatment Note    William Bryant  Today's date: 10/26/2015  Referring Provider: Jodene NamAndre Zand    Time In: 2:59 Time Out: 4:40 Total Time: 99 minutes  Treatment Diagnosis:   Encounter Diagnoses   Name Primary?   . Strain of neck muscle, subsequent encounter    . Strain of left shoulder, subsequent encounter          SUBJECTIVE:   William Bryant states that he had an MRI and showed partial tear of supraspinatus, infraspinatus as well as labral tear and ACJ arthritis.    OBJECTIVE:   AROM GH Flexion 150 pain    TREATMENT:   Today's treatment consisted of Manual Therapy x 24 minutes using soft tissue mobilizations and joint mobilizations focusing on the shoulder. Manual therapy performed to increase circulation, decrease tissue tightness, and reduce soreness. Therapeutic exercises x 40 minutes, along with neuromuscular re-education x 20 minutes as per flow sheet. Exercises performed to increase muscle strength, muscle coordination, movement quality, proprioception, exercise tolerance, and muscle endurance. Electrical stimulation with ice x 15 minutes was performed intensity to tolerance with an interferential setting to the shoulder to help reduce soreness and pain.      ASSESSMENT:   The patient tolerated today's appointment good/normal. He will be continuing with exercises to create the optimal stimulus for repair on his shoulder.    PLAN:   The current physical therapy regimen is effective;  continue present plan of care. Modify exercises and plan as needed.     Electrically signed,   Rolene CourseKayoko Sobey, MPT, COMT, FAAOMPT    [facility]

## 2015-10-29 ENCOUNTER — Ambulatory Visit (INDEPENDENT_AMBULATORY_CARE_PROVIDER_SITE_OTHER): Payer: 59

## 2015-10-29 DIAGNOSIS — S161XXD Strain of muscle, fascia and tendon at neck level, subsequent encounter: Secondary | ICD-10-CM

## 2015-10-29 DIAGNOSIS — S46912D Strain of unspecified muscle, fascia and tendon at shoulder and upper arm level, left arm, subsequent encounter: Secondary | ICD-10-CM

## 2015-10-29 NOTE — Progress Notes (Signed)
[facility]  Physical Therapy Progress Note    Dalvin Clipper  Today's date: 10/29/2015  Referring Provider: Jodene Nam    Time In: 2:55 Time Out: 4:23 Total Time: 88 minutes  Treatment Diagnosis:   Encounter Diagnoses   Name Primary?   . Strain of neck muscle, subsequent encounter    . Strain of left shoulder, subsequent encounter        Number of visits (including today's visit and initial evaluation): 21    SUBJECTIVE:   William Bryant has been seen at our office for the above diagnosis(s). The patient currently reports 4-5/10 pain described as achy with OH and reaching lateral motion.     OBJECTIVE:  AROM:       Comment   Flexion 0-40    Extension 0-45    Left Side Bend 0-29    Right Side Bend 0-26    Left Rotation 0-65    Right Rotation 0-65    Shoulder abd 160    Shoulder flex 155    Shoulder HBB T12             PROM:     Comment   Flexion 0-42    Extension 0-50    Left Side Bend 0-32    Right Side Bend 0-25    Left Rotation 0-67    Right Rotation 0-67             MMT: Manual Muscle Test for Cervical Spine: (Values scored on a 0-5 scale)     Right side  Left Side Comment   Cervical Flexion  4+/5     Cervical Extension  4/5     Cervical Rotation 4/5  4/5    Cervical Side Bend 4/5  4/5    Shoulder abduction 5/5  4-/5    Shoulder ER 5/5  4-/5    Shoulder flexion 5//5  4/5        PALPATION: Mintenderness on T1-2, levator, upper trap, teres, facet Left C4-6    NEUROLOGY:   Reflexes: WFL     Sensory Testing:   Right Left Comment   C1 normal  normal     C2 normal  normal    C3 normal  normal    C4 normal  Increased pressure    C5 normal  Increased pressure    C6 normal  normal    C7 normal  normal    C8 normal  normal    T1 normal  normal    T2 normal  normal        Myotome Testing:   Right Left Comment   C1 WNL WNL    C2 WNL WNL    C3 WFL WNL    C4 WNL WNL    C5 WNL WNL    C6 WNL WNL       C7 WNL WNL    C8 WNL WNL    T1 WNL WNL      DASH:   The patient self scores 64/180; 30/180 = Full function without difficulty    SPECIAL TESTING:  Cervical shear (+) C4/5  O'Brien(+)  Empty Can(+)      JOINT PLAY:   C4/5 Hyermobility Gr.4    IMAGING REVIEW:   MRI-Supraspinatus and infraspinatus partial tear, ACJ arthritis      TREATMENT:   Today's appointment consisted of the re-evaluation. The patient was educated regarding physical therapy progression and current findings. Treatment then consisted of Today's treatment consisted  of Manual Therapy x 20 minutes using soft tissue mobilizations and joint mobilizations focusing on the shoulder. Manual therapy performed to increase circulation, decrease tissue tightness, and reduce soreness. Therapeutic exercises x 35 minutes, along with neuromuscular re-education x 18 minutes as per flow sheet. Exercises performed to increase muscle strength, muscle coordination, movement quality, proprioception, exercise tolerance, and muscle endurance. Electrical stimulation with ice x 15 minutes was performed intensity to tolerance with an interferential setting to the shoulder to help reduce soreness and pain.  .    ASSESSMENT:   Greig Castillandrew has improved with ROM, although the progress has been minimal. Since the surgery is not a good option at this time, we will be continuing with PT. Skilled physical therapy is still appropriate. The patient is still presenting with the following impairments / functional limitations:   1)Reach forward and OH with 4/10 pain. (Previous:Reach forward with 6/10 pain level)   2)Reach into his back pocket with 2/10 pain. (Previous:Reach back pocket with 4/10 pain)     Their progress on their short-term / long-term goals: Good.    Short Term Goals: In the next 2-4 weeks, it is expected that the patient will be able to:  1) Reach forward and with 2/10 pain   2) Reach into his back pocket with no  pain    Long Term Goals: By the end of physical therapy, it is expected that the patient will:  1) be independent with their HEP  2) self score 30/180on the DASH      PLAN:   Physical therapy will continue at 2 visits per week, for an additional 5-6 weeks. We will continue to treat the patient using Manual therapy, Therapeutic exercise, Neuromuscular re-education, Functional activity training, Recreational activity progression,modification of HEP, and modalities as needed.        Thank you Jodene Namndre Zand for the continued care of your patient, William Bryant. If you have any questions or concerns regarding their plan of care or this report, please contact me at the office.     Electrically signed,   Rolene CourseKayoko Sobey, MPT, COMT, FAAOMPT    [facility]

## 2015-11-01 ENCOUNTER — Ambulatory Visit (INDEPENDENT_AMBULATORY_CARE_PROVIDER_SITE_OTHER): Payer: 59

## 2015-11-01 DIAGNOSIS — S161XXD Strain of muscle, fascia and tendon at neck level, subsequent encounter: Secondary | ICD-10-CM

## 2015-11-01 DIAGNOSIS — S46912D Strain of unspecified muscle, fascia and tendon at shoulder and upper arm level, left arm, subsequent encounter: Secondary | ICD-10-CM

## 2015-11-01 NOTE — Progress Notes (Signed)
[facility]  Daily Treatment Note    William Bryant  Today's date: 11/01/2015  Referring Provider: Jodene NamAndre Zand    Time In: 3:20 Time Out: 4:40 Total Time: 80 minutes  Treatment Diagnosis:   Encounter Diagnoses   Name Primary?   . Strain of neck muscle, subsequent encounter    . Strain of left shoulder, subsequent encounter          SUBJECTIVE:   William Bryant states that he is feeling a little better on the back of his shoulder. Main pain continues at shoulder joint.    OBJECTIVE:   Min TTP on pecs today, reduced muscle tone/guarding on levator and upper trap.    TREATMENT:   Today's treatment consisted of Manual Therapy x 20 minutes using soft tissue mobilizations and joint mobilizations focusing on the shoulder. Manual therapy performed to increase circulation, decrease tissue tightness, and reduce soreness. Therapeutic exercises x 30 minutes, along with neuromuscular re-education x 15 minutes as per flow sheet. Exercises performed to increase muscle strength, muscle coordination, movement quality, proprioception, exercise tolerance, and muscle endurance. Electrical stimulation with ice x 15 minutes was performed intensity to tolerance with an interferential setting to the shoulder to help reduce soreness and pain.      ASSESSMENT:   The patient tolerated today's appointment good/normal. He shows improved scapular control with GH flexion motion.    PLAN:   The current physical therapy regimen is effective;  continue present plan of care. Modify exercises and plan as needed.     Electrically signed,   Rolene CourseKayoko Sobey, MPT, COMT, FAAOMPT    [facility]

## 2015-11-05 ENCOUNTER — Ambulatory Visit (INDEPENDENT_AMBULATORY_CARE_PROVIDER_SITE_OTHER): Payer: 59

## 2015-11-05 DIAGNOSIS — S46912D Strain of unspecified muscle, fascia and tendon at shoulder and upper arm level, left arm, subsequent encounter: Secondary | ICD-10-CM

## 2015-11-05 DIAGNOSIS — S161XXD Strain of muscle, fascia and tendon at neck level, subsequent encounter: Secondary | ICD-10-CM

## 2015-11-05 NOTE — Progress Notes (Signed)
[facility]  Daily Treatment Note    William Bryant  Today's date: 11/05/2015  Referring Provider: Jodene NamAndre Zand    Time In: 3:29 Time Out: 4:52 Total Time: 83 minutes  Treatment Diagnosis:   Encounter Diagnoses   Name Primary?   . Strain of neck muscle, subsequent encounter    . Strain of left shoulder, subsequent encounter          SUBJECTIVE:   William Bryant states that he is noticing pain moves a bit from upper trap to a little lower.     OBJECTIVE:   AROM GH Flex 155 pain    TREATMENT:   Today's treatment consisted of Manual Therapy x 20 minutes using soft tissue mobilizations and joint mobilizations focusing on the shoulder. Manual therapy performed to increase circulation, decrease tissue tightness, and reduce soreness. Therapeutic exercises x 33 minutes, along with neuromuscular re-education x 15 minutes as per flow sheet. Exercises performed to increase muscle strength, muscle coordination, movement quality, proprioception, exercise tolerance, and muscle endurance. Electrical stimulation with ice x 15 minutes was performed intensity to tolerance with an interferential setting to the shoulder to help reduce soreness and pain.      ASSESSMENT:   The patient tolerated today's appointment good/normal. He continues to show some progress on his ROM.    PLAN:   The current physical therapy regimen is effective;  continue present plan of care. Modify exercises and plan as needed.     Electrically signed,   Rolene CourseKayoko Sobey, MPT, COMT, FAAOMPT    [facility]

## 2015-11-09 ENCOUNTER — Ambulatory Visit (INDEPENDENT_AMBULATORY_CARE_PROVIDER_SITE_OTHER): Payer: 59

## 2015-11-09 DIAGNOSIS — S46912D Strain of unspecified muscle, fascia and tendon at shoulder and upper arm level, left arm, subsequent encounter: Secondary | ICD-10-CM

## 2015-11-09 DIAGNOSIS — S161XXD Strain of muscle, fascia and tendon at neck level, subsequent encounter: Secondary | ICD-10-CM

## 2015-11-09 NOTE — Progress Notes (Signed)
[facility]  Daily Treatment Note    William Bryant  Today's date: 11/09/2015  Referring Provider: Jodene NamAndre Zand    Time In: 3:24 Time Out: 4:59 Total Time: 95 minutes  Treatment Diagnosis:   Encounter Diagnoses   Name Primary?   . Strain of neck muscle, subsequent encounter    . Strain of left shoulder, subsequent encounter          SUBJECTIVE:   William Bryant states that he continues to have pain on his shoulder as well as upper back.    OBJECTIVE:   AROM: GH Flexion 150 pain    TREATMENT:   Today's treatment consisted of Manual Therapy x 25 minutes using soft tissue mobilizations and joint mobilizations focusing on the shoulder. Manual therapy performed to increase circulation, decrease tissue tightness, and reduce soreness. Therapeutic exercises x 35 minutes, along with neuromuscular re-education x 20 minutes as per flow sheet. Exercises performed to increase muscle strength, muscle coordination, movement quality, proprioception, exercise tolerance, and muscle endurance. Electrical stimulation with ice x 15 minutes was performed intensity to tolerance with an interferential setting to the shoulder to help reduce soreness and pain.      ASSESSMENT:   The patient tolerated today's appointment good/normal. He continues to make a small progress in ROM, however the pain level remains the same.     PLAN:   The current physical therapy regimen is effective;  continue present plan of care. Modify exercises and plan as needed.     Electrically signed,   Rolene CourseKayoko Sobey, MPT, COMT, FAAOMPT    [facility]

## 2015-11-12 ENCOUNTER — Ambulatory Visit (INDEPENDENT_AMBULATORY_CARE_PROVIDER_SITE_OTHER): Payer: 59

## 2015-11-12 DIAGNOSIS — S46912D Strain of unspecified muscle, fascia and tendon at shoulder and upper arm level, left arm, subsequent encounter: Secondary | ICD-10-CM

## 2015-11-12 DIAGNOSIS — S161XXD Strain of muscle, fascia and tendon at neck level, subsequent encounter: Secondary | ICD-10-CM

## 2015-11-12 NOTE — Progress Notes (Signed)
[facility]  Daily Treatment Note    Bo Merinondrew William Salameh  Today's date: 11/12/2015  Referring Provider: Jodene NamAndre Zand    Time In: 3:54 Time Out: 4:58 Total Time: 64 minutes  Treatment Diagnosis:   Encounter Diagnoses   Name Primary?   . Strain of neck muscle, subsequent encounter    . Strain of left shoulder, subsequent encounter          SUBJECTIVE:   William Castillandrew states that he is feeling ok overall. Pain continues, but ROM might be a little better.     OBJECTIVE:   AROM: GH Flex 165 pain at end ROM    TREATMENT:   Today's treatment consisted of Manual Therapy x 19 minutes using soft tissue mobilizations and joint mobilizations focusing on the shoulder. Manual therapy performed to increase circulation, decrease tissue tightness, and reduce soreness. Therapeutic exercises x 30 minutes, along with neuromuscular re-education x 15 minutes as per flow sheet. Exercises performed to increase muscle strength, muscle coordination, movement quality, proprioception, exercise tolerance, and muscle endurance.     ASSESSMENT:   The patient tolerated today's appointment good/normal. He would definitely benefit from further PT appointments, however due to insurance, we are waiting on the authorization.    PLAN:   The current physical therapy regimen is effective;  continue present plan of care. Modify exercises and plan as needed.     Electrically signed,   Rolene CourseKayoko Sobey, MPT, COMT, FAAOMPT    [facility]

## 2015-11-15 ENCOUNTER — Ambulatory Visit (INDEPENDENT_AMBULATORY_CARE_PROVIDER_SITE_OTHER): Payer: 59

## 2015-11-15 ENCOUNTER — Ambulatory Visit: Payer: 59

## 2015-11-15 ENCOUNTER — Encounter (INDEPENDENT_AMBULATORY_CARE_PROVIDER_SITE_OTHER): Payer: Self-pay

## 2015-11-15 VITALS — BP 130/70 | HR 78 | Ht 70.5 in | Wt 230.0 lb

## 2015-11-15 DIAGNOSIS — E782 Mixed hyperlipidemia: Secondary | ICD-10-CM | POA: Insufficient documentation

## 2015-11-15 DIAGNOSIS — E119 Type 2 diabetes mellitus without complications: Secondary | ICD-10-CM

## 2015-11-15 DIAGNOSIS — Z23 Encounter for immunization: Secondary | ICD-10-CM

## 2015-11-15 DIAGNOSIS — Z6832 Body mass index (BMI) 32.0-32.9, adult: Secondary | ICD-10-CM

## 2015-11-15 LAB — COMPREHENSIVE METABOLIC PANEL
ALT (GPT): 38 U/L (ref 10–48)
AST (GOT): 21 U/L (ref 9–38)
Albumin: 4.4 g/dL (ref 3.5–5.2)
Alkaline Phosphatase (Total): 53 U/L (ref 37–159)
Anion Gap: 7 (ref 4–12)
Bilirubin (Total): 0.9 mg/dL (ref 0.2–1.3)
Calcium: 9.6 mg/dL (ref 8.9–10.2)
Carbon Dioxide, Total: 28 meq/L (ref 22–32)
Chloride: 102 meq/L (ref 98–108)
Creatinine: 0.8 mg/dL (ref 0.51–1.18)
GFR, Calc, African American: >60 mL/min/{1.73_m2} (ref >59)
GFR, Calc, European American: >60 mL/min/{1.73_m2} (ref >59)
Glucose: 216 mg/dL — ABNORMAL HIGH (ref 62–125)
Potassium: 4 meq/L (ref 3.6–5.2)
Protein (Total): 6.4 g/dL (ref 6.0–8.2)
Sodium: 137 meq/L (ref 135–145)
Urea Nitrogen: 17 mg/dL (ref 8–21)

## 2015-11-15 NOTE — Progress Notes (Signed)
DATE: 11/15/2015     PHYSICIAN NOTE:  Patient Name: William Bryant  Medical Record Number: [id number]  Visit Date/time: 11/15/2015     Date of Birth: January 07, 1951    CHIEF COMPLAINT:    William Bryant is a 65 year old male who is here today for review of multiple medical problems.    PROBLEMS TO BE ADDRESSED ON TODAYS VISIT:  (E11.9) Type 2 diabetes mellitus without complication, without long-term current use of insulin (HCC)  (primary encounter diagnosis)  (E78.2) Mixed hyperlipidemia  (Z23) Needs flu shot    HPI:  History provided by patient.    William Bryant is a 65 year old male who is here today for review of multiple medical problems as reported above. Patient has no new concerns about his health today.     Patient is due for an influenza vaccine and zostavax.       Past Medical History:  As cited in EHR, updated and reviewed by myself on 08/23/2014    Family History:  As cited in EHR, updated and reviewed by myself on 08/23/2014    Medications/Allergies:  As cited in EHR, updated and reviewed by myself on 08/23/2014        REVIEW OF SYSTEMS:     male    ENDOCRINE: H/o type II DM, hyperlipidemia     PHYSICAL EXAM:  BP 130/70   Pulse 78   Ht 5' 10.5" (1.791 m)   Wt (!) 230 lb (104.3 kg)   BMI 32.54 kg/m     Physical Examination-male: Vital signs as noted above.    General Appearance and Mental Status: Well Developed/Well Nourished  [ ]  Slender  [ ]  Average  [ ]  Heavy [x ] Obese  [x ] Appropriate Affect, Oriented to Person, Place, Time    Skin: Warm, dry to touch    Head: Normocephalic, atraumatic    Eyes: EOMI PERLA    Pharynx: Clear    Neck: Supple, without JVD  or goiter    Chest: Clear     CV: Heart: RRR, no murmur, no rub, no S3, S4 noted    Extrem: no edema    MSK:   normal gait and station, no clubbing, no cyanosis;  upper and lower extremities                show no defects in symmetry, crepitance, tenderness,  masses, or effusion              normal range of motion without  dislocation, subluxation or laxity; normal muscle               tone and strength.    Neuro: intact       LAB RESULTS:  Pending.     ASSESSMENT AND PLAN:  Pertinent Labs & Imaging studies reviewed. (See chart for details)  Prior EMR records reviewed in EPIC as available and clinically relevant.    1. Type 2 diabetes mellitus without complication, without long-term current use of insulin (HCC)  Patient has a h/o type II DM and his last A1C on 06/25/15 was 7.3. He is currently taking Metformin 1000mg  twice daily and Glimepiride 2mg  daily. I ordered an A1C for further evaluation.  - HEMOGLOBIN A1C, HPLC    2. Mixed hyperlipidemia  Recent lipids on 06/25/15 showed an LDL cholesterol of 60 and total cholesterol of 131. He is currently taking Simvastatin 20mg  daily.   - COMPREHENSIVE METABOLIC PANEL    3. Needs  flu shot  Influenza vaccine given today IM by MA.   - influenza vaccine quadrivalent PF (adult) 0.5 mL IM - [id number]      Further treatment dependant on the results of the tests.    Seek immediate attention if symptoms are worsening.    Call if symptoms are not resolving with therapy.    This appointment took 15 minutes and more than 50 % was spent in counseling and/or coordination of care.    Portions of this chart were written by Valla LeaverBreanne Autumm Hattery, Medical Scribe with oversight by Katy FitchJoel Hagedorn, MD 11/15/2015 4:01 PM    I,  Katy FitchJoel Hagedorn, have reviewed the information recorded by the scribe for accuracy and agree with its contents.      This medical record has been electronically generated with voice recognition software. Every attempt is made to assure accuracy.  However, it may contain errors related to this process.

## 2015-11-15 NOTE — Progress Notes (Signed)
I, Katy FitchJoel Angeni Chaudhuri, MD interviewed and examined the patient while overseeing the documentation performed by my Medical Scribe, Valla LeaverBreanne Wiekamp. I have reviewed and revised as necessary the scribe's note and agree with the documented findings and plan of care. Please refer to the scribe's note below for further detailed information regarding the patient encounter and exam.  11/15/2015. 4:43 PM.

## 2015-11-15 NOTE — Progress Notes (Signed)
SCREENING QUESTIONS: INACTIVATED INFLUENZA VACCINE:  1. Do you have a high fever, severe cold-like symptoms or serious infection? NO   2. Do you have any food allergies such as eggs, chicken, chicken feathers, chicken dander, or gelatin? NO  3. Have you had a severe reaction to a vaccine in the past? NO  4. Have you had any severe reaction to a vaccine in the past such as a seizure, a convulsion from a high fever, or a paralysis problem called Guillain-Barre Syndrome? NO  5. QUESTIONS FOR WOMEN: Are you pregnant or planning on becoming pregnant in the next month? (If YES, you may give Fluarix or Fluzone Pre-Filled syringe rather than Multi-Dose Fluzone). NO    If the patient/parent/or guardian answered "yes" to any of the above questions, consult with the provider to review the responses prior to administering vaccination. Parents with a contraindication to immunization will not receive the immunization without a specific physician order to vaccinate the patient and discussion of risk and benefits related to the contraindication.     Physician Order for Administration of Vaccine(s) When Contraindications Are Present  I have reviewed the existence in this patient's health history of possible contraindication(s) to administration of vaccine. The benefits to this patient outweigh the potential risks of immunization. Administer vaccine(s): yes  Consuello BossierCampbell, Dayna M, 11/15/2015 4:01 PM MA.

## 2015-11-15 NOTE — Patient Instructions (Signed)
Seek immediate attention if symptoms are worsening. Call if symptoms are not resolving with therapy.    A letter will be coming within the next two weeks reviewing the lab results.    Best wishes,    Dr. Hagedorn

## 2015-11-16 LAB — HEMOGLOBIN A1C, HPLC: Hemoglobin A1C: 7.8 % — ABNORMAL HIGH (ref 4.0–6.0)

## 2015-11-19 ENCOUNTER — Other Ambulatory Visit (INDEPENDENT_AMBULATORY_CARE_PROVIDER_SITE_OTHER): Payer: Self-pay

## 2015-11-19 ENCOUNTER — Encounter (INDEPENDENT_AMBULATORY_CARE_PROVIDER_SITE_OTHER): Payer: Self-pay

## 2015-11-19 DIAGNOSIS — E119 Type 2 diabetes mellitus without complications: Secondary | ICD-10-CM

## 2015-11-20 ENCOUNTER — Telehealth (INDEPENDENT_AMBULATORY_CARE_PROVIDER_SITE_OTHER): Payer: Self-pay | Admitting: Family Medicine

## 2015-11-20 NOTE — Telephone Encounter (Signed)
Spoke with patient and answered his questions regarding his future cortisone injection to be given by Dr. Rob BuntingStorey on 11/26/15.     Patient will be asking for a letter from Dr. Rob BuntingStorey regarding his diagnosis/prognosis/trtm plan based on what was seen in the MRI.  Patient will will discuss what he needs in the letter at the time of his appt on the 13th of Nov.  MBechen RN

## 2015-11-20 NOTE — Telephone Encounter (Signed)
General Message:    Detailed Message: PT has questions regarding Cortisone shots. Please call back to discuss and advise.   Return Call: General message okay

## 2015-11-26 ENCOUNTER — Ambulatory Visit (INDEPENDENT_AMBULATORY_CARE_PROVIDER_SITE_OTHER): Payer: 59 | Admitting: Family Medicine

## 2015-11-26 ENCOUNTER — Encounter (INDEPENDENT_AMBULATORY_CARE_PROVIDER_SITE_OTHER): Payer: Self-pay | Admitting: Family Medicine

## 2015-11-26 VITALS — BP 140/72 | HR 91 | Ht 70.5 in | Wt 230.0 lb

## 2015-11-26 DIAGNOSIS — M19012 Primary osteoarthritis, left shoulder: Secondary | ICD-10-CM

## 2015-11-26 DIAGNOSIS — Z6832 Body mass index (BMI) 32.0-32.9, adult: Secondary | ICD-10-CM

## 2015-11-26 MED ORDER — BETAMETHASONE SOD PHOS & ACET 6 (3-3) MG/ML IJ SUSP
6.0000 mg | Freq: Once | INTRAMUSCULAR | Status: AC
Start: 2015-11-26 — End: 2015-11-26
  Administered 2015-11-26: 6 mg via INTRA_ARTICULAR

## 2015-11-26 NOTE — Progress Notes (Addendum)
11/26/2015    Chief Complaint   Patient presents with   . Shoulder Pain     Recheck L shoulder. Hoping to receive a cortisone injection left shoulder today.        William Bryant returns for followup of [phone](M19.012) Arthrosis of left acromioclavicular joint  (primary encounter diagnosis)  Plan: ARTHROCENTESIS ASPIR&/INJ INTERM JT/BURS W/O         US, BETAmethasone Sod Phos & Acet ([facility]         Soluspan) 6 (3-3) mg/mL injection          .     Current complaints: persistent pain with ABD and rotation movement  Functional impairments: pain  Interval treatment or imaging: PT    Current Outpatient Prescriptions   Medication Sig Dispense Refill   . Aspirin 81 MG Oral Chew Tab Take 81 mg by mouth every day.     . Benazepril HCl 5 MG Oral Tab Take 1 tablet (5 mg) by mouth daily. Take 1 Tab by mouth daily at bedtime. To prevent kidney damage. 30 tablet 6   . Cholecalciferol (D 2000) 2000 UNITS Oral Tab Take 1 Tab by mouth every day.     . Diclofenac Sodium 75 MG Oral Tab EC Take 1 tablet (75 mg) by mouth 2 times a day. 60 tablet 0   . Glimepiride 2 MG Oral Tab Take 1 tablet (2 mg) by mouth daily. 60 tablet 6   . Glimepiride 2 MG Oral Tab Take 0.5 tablets (1 mg) by mouth 2 times a day. Take before breakfast & dinner. 90 tablet 2   . MetFORMIN HCl 1000 MG Oral Tab TAKE ONE TABLET BY MOUTH TWICE DAILY WITH LARGEST MEALS. 60 tablet 6   . Simvastatin 20 MG Oral Tab Take 1 tablet (20 mg) by mouth daily. 30 tablet 6     Current Facility-Administered Medications   Medication Dose Route Frequency Provider Last Rate Last Dose   . BETAmethasone Sod Phos & Acet ([facility] Soluspan) 6 (3-3) mg/mL injection  6 mg Intra-arTICular Once Lolita PatellaStorey, Camora Tremain Dean, DO             14 point review of systems is reviewed and unchanged from last visit.  Noncontributory to CC.    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

## 2015-12-27 ENCOUNTER — Ambulatory Visit (INDEPENDENT_AMBULATORY_CARE_PROVIDER_SITE_OTHER): Payer: 59 | Admitting: Family Medicine

## 2015-12-27 VITALS — BP 132/78 | HR 76 | Ht 70.5 in | Wt 230.0 lb

## 2015-12-27 DIAGNOSIS — M19012 Primary osteoarthritis, left shoulder: Secondary | ICD-10-CM

## 2015-12-27 DIAGNOSIS — Z6832 Body mass index (BMI) 32.0-32.9, adult: Secondary | ICD-10-CM

## 2015-12-27 DIAGNOSIS — S46912S Strain of unspecified muscle, fascia and tendon at shoulder and upper arm level, left arm, sequela: Secondary | ICD-10-CM

## 2015-12-27 DIAGNOSIS — M7502 Adhesive capsulitis of left shoulder: Secondary | ICD-10-CM
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
## 2016-01-21 ENCOUNTER — Telehealth (INDEPENDENT_AMBULATORY_CARE_PROVIDER_SITE_OTHER): Payer: Self-pay | Admitting: Family Medicine

## 2016-01-21 DIAGNOSIS — S46912S Strain of unspecified muscle, fascia and tendon at shoulder and upper arm level, left arm, sequela: Secondary | ICD-10-CM

## 2016-01-21 NOTE — Telephone Encounter (Signed)
Referral Request:    Reason for referral: Left shoulder   Clinic name/physician if applicable[facility]  Clinic contact information: fax [phone]  Additional information: Please advise and fax referral to [facility] for patients left shoulder.  Return Call: Detailed message on voicemail

## 2016-01-22 NOTE — Telephone Encounter (Signed)
ORDERS PENDED:  PT referral requested by [facility].    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

## 2016-01-28 ENCOUNTER — Encounter (INDEPENDENT_AMBULATORY_CARE_PROVIDER_SITE_OTHER): Payer: 59

## 2016-01-31 ENCOUNTER — Encounter (INDEPENDENT_AMBULATORY_CARE_PROVIDER_SITE_OTHER): Payer: 59

## 2016-02-04 ENCOUNTER — Encounter (INDEPENDENT_AMBULATORY_CARE_PROVIDER_SITE_OTHER): Payer: 59

## 2016-02-07 ENCOUNTER — Encounter (INDEPENDENT_AMBULATORY_CARE_PROVIDER_SITE_OTHER): Payer: 59

## 2016-03-05 DIAGNOSIS — I10 Essential (primary) hypertension: Secondary | ICD-10-CM | POA: Insufficient documentation

## 2016-03-06 ENCOUNTER — Ambulatory Visit: Payer: No Typology Code available for payment source

## 2016-03-06 ENCOUNTER — Ambulatory Visit (INDEPENDENT_AMBULATORY_CARE_PROVIDER_SITE_OTHER): Payer: No Typology Code available for payment source

## 2016-03-06 ENCOUNTER — Encounter (INDEPENDENT_AMBULATORY_CARE_PROVIDER_SITE_OTHER): Payer: Self-pay

## 2016-03-06 VITALS — BP 110/70 | HR 92 | Ht 70.5 in | Wt 235.0 lb

## 2016-03-06 DIAGNOSIS — E119 Type 2 diabetes mellitus without complications: Secondary | ICD-10-CM

## 2016-03-06 DIAGNOSIS — I10 Essential (primary) hypertension: Secondary | ICD-10-CM | POA: Insufficient documentation

## 2016-03-06 DIAGNOSIS — E782 Mixed hyperlipidemia: Secondary | ICD-10-CM | POA: Insufficient documentation

## 2016-03-06 DIAGNOSIS — R350 Frequency of micturition: Secondary | ICD-10-CM

## 2016-03-06 DIAGNOSIS — N401 Enlarged prostate with lower urinary tract symptoms: Secondary | ICD-10-CM

## 2016-03-06 DIAGNOSIS — Z6833 Body mass index (BMI) 33.0-33.9, adult: Secondary | ICD-10-CM

## 2016-03-06 DIAGNOSIS — R8299 Other abnormal findings in urine: Secondary | ICD-10-CM | POA: Insufficient documentation

## 2016-03-06 DIAGNOSIS — N489 Disorder of penis, unspecified: Secondary | ICD-10-CM

## 2016-03-06 LAB — CBC, DIFF
% Basophils: 1 %
% Eosinophils: 1 %
% Immature Granulocytes: 0 %
% Lymphocytes: 19 %
% Monocytes: 9 %
% Neutrophils: 70 %
% Nucleated RBC: 0 %
Absolute Eosinophil Count: 0.07 10*3/uL (ref 0.00–0.50)
Absolute Lymphocyte Count: 1.32 10*3/uL (ref 1.00–4.80)
Basophils: 0.04 10*3/uL (ref 0.00–0.20)
Hematocrit: 47 % (ref 38–50)
Hemoglobin: 16 g/dL (ref 13.0–18.0)
Immature Granulocytes: 0.03 10*3/uL (ref 0.00–0.05)
MCH: 30 pg (ref 27.3–33.6)
MCHC: 33.8 g/dL (ref 32.2–36.5)
MCV: 89 fL (ref 81–98)
Monocytes: 0.61 10*3/uL (ref 0.00–0.80)
Neutrophils: 4.96 10*3/uL (ref 1.80–7.00)
Nucleated RBC: 0 10*3/uL
Platelet Count: 276 10*3/uL (ref 150–400)
RBC: 5.33 10*6/uL (ref 4.40–5.60)
RDW-CV: 13.2 % (ref 11.6–14.4)
WBC: 7.03 10*3/uL (ref 4.3–10.0)

## 2016-03-06 LAB — COMPREHENSIVE METABOLIC PANEL
ALT (GPT): 39 U/L (ref 10–48)
AST (GOT): 23 U/L (ref 9–38)
Albumin: 4.6 g/dL (ref 3.5–5.2)
Alkaline Phosphatase (Total): 60 U/L (ref 37–159)
Anion Gap: 10 (ref 4–12)
Bilirubin (Total): 1.2 mg/dL (ref 0.2–1.3)
Calcium: 9.9 mg/dL (ref 8.9–10.2)
Carbon Dioxide, Total: 29 meq/L (ref 22–32)
Chloride: 101 meq/L (ref 98–108)
Creatinine: 1.04 mg/dL (ref 0.51–1.18)
GFR, Calc, African American: >60 mL/min/{1.73_m2} (ref >59)
GFR, Calc, European American: >60 mL/min/{1.73_m2} (ref >59)
Glucose: 203 mg/dL — ABNORMAL HIGH (ref 62–125)
Potassium: 4.4 meq/L (ref 3.6–5.2)
Protein (Total): 6.6 g/dL (ref 6.0–8.2)
Sodium: 140 meq/L (ref 135–145)
Urea Nitrogen: 23 mg/dL — ABNORMAL HIGH (ref 8–21)

## 2016-03-06 LAB — URINALYSIS MACROSCOPIC, POC
Bilirubin (Qual), URN: NEGATIVE
Glucose (Qual), URN: >1000 mg/dL — AB
Ketones, URN: 15 mg/dL — AB
Leukocyte Esterase, URN: NEGATIVE
Nitrites POC, URN: NEGATIVE
Occult Blood, URN: NEGATIVE
Protein (Alb Semiquant), URN: NEGATIVE
Specific Gravity, URN: >1.029 (ref 1.005–1.030)
pH, URN: 5.5 (ref 5–8)

## 2016-03-06 LAB — CK, CREATINE KINASE, TOTAL ACTIVITY: Creatine Kinase Total Activity: 91 U/L (ref 62–325)

## 2016-03-06 LAB — URINALYSIS MICROSCOPIC
Bacteria, URN: NONE SEEN
Epith Cells_Renal/Trans,URN: NEGATIVE /HPF
Epith Cells_Squamous, URN: NEGATIVE /LPF
RBC, URN: NEGATIVE /HPF
WBC, URN: NEGATIVE /HPF

## 2016-03-06 LAB — PSA, DIAGNOSTIC/MONITORING: PSA, Diagnostic/Monitoring: 1.89 ng/mL (ref 0.00–4.00)

## 2016-03-06 MED ORDER — GLIMEPIRIDE 2 MG OR TABS
2.0000 mg | ORAL_TABLET | Freq: Every day | ORAL | 6 refills | Status: DC
Start: 2016-03-06 — End: 2016-09-04

## 2016-03-06 NOTE — Progress Notes (Signed)
I, Katy FitchJoel Sofia Jaquith, MD interviewed and examined the patient while overseeing the documentation performed by my Medical Scribe, Emelia LoronKarin Muggli. I have reviewed and revised as necessary the scribe's note and agree with the documented findings and plan of care. Please refer to the scribe's note below for further detailed information regarding the patient encounter and exam.  03/06/2016. 7:42 PM.

## 2016-03-06 NOTE — Patient Instructions (Signed)
Seek immediate attention if symptoms are worsening. Call if symptoms are not resolving with therapy.    A letter will be coming within the next two weeks reviewing the lab results.    Best wishes,    Dr. Tariq Pernell

## 2016-03-06 NOTE — Progress Notes (Signed)
DATE: 03/06/2016     PHYSICIAN NOTE:  Patient Name: William Bryant  Medical Record Number: [id number]  Visit Date/time: 03/06/2016     Date of Birth: [birth year]    CHIEF COMPLAINT:  Chief Complaint   Patient presents with   . Diabetes   . Urine Problem       William Bryant is a 66 year old male who is here today for review of multiple medical problems.    PROBLEMS TO BE ADDRESSED ON TODAYS VISIT:  (E11.9) Type 2 diabetes mellitus without complication, without long-term current use of insulin (HCC)  (primary encounter diagnosis)  (E78.2) Mixed hyperlipidemia  (I10) Essential hypertension  (R35.0) Urinary frequency  (N40.1,  R35.0) Benign prostatic hyperplasia with urinary frequency  (N48.9) Penile lesion    HPI:  History provided by patient.    William Bryant is a 66 year old male who is here today for review of multiple medical problems as reported above. He arrives today with complaints of increased urinary frequency with an associated foul odor. The patient also complains of a firmer area in the shaft of his penis. This is not palpable from the externa, however, the patient states it feels different and that there is slight pain with applied pressure.     The patient is also requesting a referral to a nutritionist for better control of his diabetes.     Past Medical History:  As cited in EHR, updated and reviewed by myself on 03/06/2016    Family History:  As cited in EHR, updated and reviewed by myself on 03/06/2016    Medications/Allergies:  As cited in EHR, updated and reviewed by myself on 03/06/2016    REVIEW OF SYSTEMS:     male    CARDIOVASCULAR: H/o HTN  GI: Denies, change in appetite, dysphagia, nausea, vomiting, dyspepsia, abdominal pain, diarrhea, constipation, hematochezia, melena  GENITOURINARY: H/o urinary frequency, ED, BPH; Reports increased frequency of urination, foul smelling odor, firmer area on shaft of penis  ENDOCRINE: H/o hyperlipidemia, type II diabetes          PHYSICAL EXAM:  BP 110/70   Pulse 92   Ht 5' 10.5" (1.791 m)   Wt (!) 235 lb (106.6 kg)   BMI 33.24 kg/m     Physical Examination-male: Vital signs as noted above.    General Appearance and Mental Status: Well Developed/Well Nourished  [ ]  Slender  [ ]  Average  [ ]  Heavy [x ] Obese  [x ] Appropriate Affect, Oriented to Person, Place, Time    Skin: Warm, dry to touch    Head: Normocephalic, atraumatic    Eyes: EOMI, PERLA    Ears: TM's clear bilaterally    Nose: Septum intact, no lesions, pink mucosa and turbinates, no discharge    Pharynx: Clear    Neck: Supple, without JVD  or goiter    Chest: Clear     CV: Heart: RRR, no murmur, no rub, no S3, S4 noted    Breasts: no or slight asymetry,soft,non tender,glandular tissue felt, no discharge, no dimpling, no dominate lesions noted  Male, Lucienne MinksKarin was present for exam.     Abdomen: Soft, bowel sounds present, without masses or tenderness noted, no hepatosplenomegaly noted    Male GU:         Testes: nontender, no masses noted    Penis:  There seems to be a possible mass noted in the left lateral mid-shaft of the penis.    Rectal:  Deferred  Extrem: no edema    MSK:   normal gait and station, no clubbing, no cyanosis;  upper and lower extremities                show no defects in symmetry, crepitance, tenderness,  masses, or effusion              normal range of motion without dislocation, subluxation or laxity; normal muscle               tone and strength.    Neuro: intact       LAB RESULTS:  Pending.     ASSESSMENT AND PLAN:  Pertinent Labs & Imaging studies reviewed. (See chart for details)  Prior EMR records reviewed in EPIC as available and clinically relevant.    1. Type 2 diabetes mellitus without complication, without long-term current use of insulin (HCC)  Patient has a h/o type II DM and his last A1C on 11/15/15 was 7.8. He is currently taking Glimepiride 1mg  twice daily and Metformin 1000mg  twice daily. I ordered an A1C today for further  evaluation.  - Glimepiride 2 MG Oral Tab; Take 1 tablet (2 mg) by mouth daily.  Dispense: 60 tablet; Refill: 6  - HEMOGLOBIN A1C, HPLC  - REFERRAL TO ENDOCRINOLOGY    2. Mixed hyperlipidemia  Patient has a h/o hyperlipidemia and is currently taking Simvastatin 20mg  daily.  I ordered a fasting lipids, CK, and CMP for further evaluation.  - COMPREHENSIVE METABOLIC PANEL  - CREATINE KINASE TOTAL ACTIVITY    3. Essential hypertension  Patient has a h/o HTN and is currently taking Benazepril 5mg  daily. Blood pressure today is 110/70. I ordered a CBC and CMP for further evaluation.  - CBC, DIFF    4. Urinary frequency  Patient reports urinary frequency, especially at night. I ordered a urinalysis and PSA for further evaluation.   - PROSTATE SPECIFIC ANTIGEN  - Urinalysis Macroscopic, POC    5. Benign prostatic hyperplasia with urinary frequency  Patient has a h/o BPH with associated urinary frequency. I referred him to Dr. Lenn Sink or Dr. Samule Ohm for further evaluation.   - REFERRAL TO UROLOGY    6. Penile lesion  Patient complains of a "firmer area" in the shaft of his penis. On examination, there was no visible abnormality, however the patient reports pain to palpation of the shaft. I referred him to Dr. Lenn Sink or Dr. Samule Ohm for further evaluation.   - REFERRAL TO UROLOGY      Further treatment dependant on the results of the tests.    Seek immediate attention if symptoms are worsening.    Call if symptoms are not resolving with therapy.    This appointment took 25 minutes and more than 50 % was spent in counseling and/or coordination of care.    Portions of this chart were written by Emelia Loron, Medical Scribe with oversight by Katy Fitch, MD 03/06/2016 1:44 PM    I, Katy Fitch, have reviewed the information recorded by the scribe for accuracy and agree with its contents.      This medical record has been electronically generated with voice recognition software. Every attempt is made to assure accuracy.  However, it  may contain errors related to this process.

## 2016-03-07 LAB — HEMOGLOBIN A1C, HPLC: Hemoglobin A1C: 8.5 % — ABNORMAL HIGH (ref 4.0–6.0)

## 2016-03-08 ENCOUNTER — Encounter (INDEPENDENT_AMBULATORY_CARE_PROVIDER_SITE_OTHER): Payer: Self-pay

## 2016-03-08 LAB — URINE C/S: Culture: 2000

## 2016-03-13 ENCOUNTER — Other Ambulatory Visit (INDEPENDENT_AMBULATORY_CARE_PROVIDER_SITE_OTHER): Payer: Self-pay

## 2016-03-13 ENCOUNTER — Telehealth (INDEPENDENT_AMBULATORY_CARE_PROVIDER_SITE_OTHER): Payer: Self-pay

## 2016-03-13 DIAGNOSIS — I10 Essential (primary) hypertension: Secondary | ICD-10-CM

## 2016-03-13 MED ORDER — BENAZEPRIL HCL 5 MG OR TABS
5.0000 mg | ORAL_TABLET | Freq: Every day | ORAL | 12 refills | Status: DC
Start: 2016-03-13 — End: 2017-03-23

## 2016-03-13 NOTE — Telephone Encounter (Signed)
Pharmacy notified. They confirmed receipt of RX.

## 2016-03-13 NOTE — Telephone Encounter (Signed)
Refill issued

## 2016-03-13 NOTE — Telephone Encounter (Addendum)
2nd call    Benazepril HCl 5 MG Oral Tab        Sig: Take 1 tablet (5 mg) by mouth daily. Take 1 Tab by mouth daily at bedtime. To prevent kidney damage          [facility] [phone]      At pharmacy now 11:30     OUT of refills

## 2016-03-17 ENCOUNTER — Telehealth (INDEPENDENT_AMBULATORY_CARE_PROVIDER_SITE_OTHER): Payer: Self-pay

## 2016-03-17 DIAGNOSIS — E119 Type 2 diabetes mellitus without complications: Secondary | ICD-10-CM

## 2016-03-17 NOTE — Telephone Encounter (Signed)
Referral dated 03/06/16 for Diabetes (nutrition education) at [facility], not available,[facility]

## 2016-03-17 NOTE — Telephone Encounter (Signed)
Please complete and sign referral.

## 2016-03-24 ENCOUNTER — Telehealth (INDEPENDENT_AMBULATORY_CARE_PROVIDER_SITE_OTHER): Payer: Self-pay

## 2016-03-24 ENCOUNTER — Ambulatory Visit: Payer: No Typology Code available for payment source

## 2016-03-24 DIAGNOSIS — E119 Type 2 diabetes mellitus without complications: Secondary | ICD-10-CM | POA: Insufficient documentation

## 2016-03-24 DIAGNOSIS — E118 Type 2 diabetes mellitus with unspecified complications: Secondary | ICD-10-CM

## 2016-03-24 MED ORDER — METFORMIN HCL 1000 MG OR TABS
ORAL_TABLET | ORAL | 6 refills | Status: DC
Start: 2016-03-24 — End: 2016-04-02

## 2016-03-24 NOTE — Telephone Encounter (Signed)
Benazepril & Glimepiride were recently filled.  Please sign for metformin.

## 2016-03-24 NOTE — Telephone Encounter (Signed)
Patient calling to request a refill for  Medication: (1)Benazepril (2) Glimepiride  (3) Metformin  Preferred Pharmacy:   [facility] [phone] [place] [phone]   [address]  [place] [id number]  Phone: [phone] Fax: [phone]    Patient wanting to be notified when done: YES    Patient would ike to pick up today.

## 2016-03-31 ENCOUNTER — Telehealth (INDEPENDENT_AMBULATORY_CARE_PROVIDER_SITE_OTHER): Payer: Self-pay | Admitting: Urology

## 2016-03-31 ENCOUNTER — Encounter (INDEPENDENT_AMBULATORY_CARE_PROVIDER_SITE_OTHER): Payer: Self-pay | Admitting: Urology

## 2016-03-31 ENCOUNTER — Ambulatory Visit (INDEPENDENT_AMBULATORY_CARE_PROVIDER_SITE_OTHER): Payer: No Typology Code available for payment source | Admitting: Urology

## 2016-03-31 VITALS — HR 64 | Resp 18 | Ht 71.0 in | Wt 232.0 lb

## 2016-03-31 DIAGNOSIS — N521 Erectile dysfunction due to diseases classified elsewhere: Secondary | ICD-10-CM

## 2016-03-31 DIAGNOSIS — R350 Frequency of micturition: Secondary | ICD-10-CM | POA: Insufficient documentation

## 2016-03-31 DIAGNOSIS — N486 Induration penis plastica: Secondary | ICD-10-CM

## 2016-03-31 DIAGNOSIS — Z6832 Body mass index (BMI) 32.0-32.9, adult: Secondary | ICD-10-CM

## 2016-03-31 LAB — PR U/A NONAUTO W/MICRO, ONSITE
Bilirubin (Indirect): NEGATIVE
Leukocytes: NEGATIVE
Nitrite, URN: NEGATIVE
Occult Blood, URN: NEGATIVE
Protein: NEGATIVE mg/dL
Specific Gravity, URN: 1.025 (ref 1.005–1.030)
Urobilinogen, URN: 0.2 E.U./dL (ref 0.2–1.0)
pH, URN (UWNC): 5 (ref 5.0–8.0)

## 2016-03-31 LAB — PR MEAS POST-VOIDING RESIDUAL URINE&/BLADDER CAP

## 2016-03-31 MED ORDER — SILDENAFIL CITRATE 20 MG OR TABS
20.0000 mg | ORAL_TABLET | Freq: Every day | ORAL | 11 refills | Status: DC | PRN
Start: 2016-03-31 — End: 2016-04-08

## 2016-03-31 MED ORDER — TAMSULOSIN HCL 0.4 MG OR CAPS
0.4000 mg | ORAL_CAPSULE | Freq: Every day | ORAL | 11 refills | Status: DC
Start: 2016-03-31 — End: 2017-03-23

## 2016-03-31 NOTE — Telephone Encounter (Signed)
I called and left a detailed message on pharmacy vm clarifying Sig as 1-5 tabs po QD PRN. Inge RiseJamie L Vallejo, CMA

## 2016-03-31 NOTE — Progress Notes (Signed)
UROLOGY NEW PATIENT VISIT    ID/CC:    Chief Complaint   Patient presents with   . Urine Problem     urinary freq/urgency       History of Present Illness:   William Bryant is a 66 year old male who presents today.  He is referred with problems with urinary frequency.  This has been going on for quite some time.  He was seen by Dr. Emeline Darling 3 years ago who prescribed alpha blockade but the patient never took it.  His symptoms are worsening slightly.  He also has problems with curvature of his penis to the left.  This is somewhat concerning to him but his wife is also having problems with pain with intercourse.  I gave him some literature today for her.    Review of Systems:   A 14 system review of system was filled out by the patient.  Positives include significant diabetes and excessive caffeine intake.  The rest of the review of system is negative and can be linked to in the chart dated today.        Past Medical Hx:    Past Medical History:   Diagnosis Date   . BPH (benign prostatic hyperplasia)    . Diabetes mellitus (HCC)    . Diverticulosis    . History of BPH    . History of erectile dysfunction    . Hypertension    . Lower GI bleed      Patient Active Problem List   Diagnosis   . Type 2 diabetes mellitus without complication (HCC)   . Lower GI bleed   . Diverticulosis of large intestine with hemorrhage   . Mixed hyperlipidemia   . BPH (benign prostatic hyperplasia)   . Erectile dysfunction   . Family history of early CAD--mother 40   . Right cervical radiculopathy   . Upper respiratory tract infection   . Cough   . Hoarseness   . Cervical strain   . Strain of left shoulder   . Arthrosis of left acromioclavicular joint   . Adhesive capsulitis of left shoulder   . Essential hypertension   . Urinary frequency       Past Surgical Hx:    Past Surgical History:   Procedure Laterality Date   . knee replacement x2         Family and Social Hx:    Mr. Feeny , family history includes Breast Cancer in his  sister; Cancer in his father; Deep Vein Thrombosis in his father; Heart Disease in his mother; Myocardial Infarction in an other family member; Prostate Cancer in his father.Marland Kitchen He  reports that he quit smoking about 17 years ago. His smoking use included Cigarettes. He quit after 22.00 years of use. He has never used smokeless tobacco. He reports that he drinks alcohol. He reports that he does not use drugs..  Social History     Social History Narrative    Married with one son. Medical and legal interpreter, self employed. Rare ETOH. Former smoker. No social drug use. Rare exercise.         Colonoscopy performed in June 2015 with Dr. Jaynie Collins, cleared for five years         Ophthalmology visit- referral to Ahmed Prima on 09/20/14        TDAP- 11/2012    Zoster-advised 11/15/15    Influenza-11/15/15        Hepatitis C and HIV screening-09/20/14  Diabetic Eye Exam-done on 10/09/14 by Dr. Dyane DustmanBence, bilateral nuclear cataracts, no retinopathy           Active Meds:    Outpatient Prescriptions Marked as Taking for the 03/31/16 encounter (Office Visit) with Gwenlyn Fudgeowney, James Robert, MD   Medication Sig Dispense Refill   . Aspirin 81 MG Oral Chew Tab Take 81 mg by mouth every day.     . Benazepril HCl 5 MG Oral Tab Take 1 tablet (5 mg) by mouth daily. Take 1 Tab by mouth daily at bedtime. To prevent kidney damage. 90 tablet 12   . Cholecalciferol (D 2000) 2000 UNITS Oral Tab Take 1 Tab by mouth every day.     . Glimepiride 2 MG Oral Tab Take 1 tablet (2 mg) by mouth daily. (Patient taking differently: Take 4 mg by mouth daily. ) 60 tablet 6   . MetFORMIN HCl 1000 MG Oral Tab TAKE ONE TABLET BY MOUTH TWICE DAILY WITH LARGEST MEALS. 60 tablet 6   . Simvastatin 20 MG Oral Tab Take 1 tablet (20 mg) by mouth daily. 30 tablet 6       Allergies:    Allergies as of 03/31/2016   . (No Known Allergies)       OBJECTIVE:  Physical Exam:  Pulse 64   Resp 18   Ht 5\' 11"  (1.803 m)   Wt (!) 232 lb (105.2 kg)   BMI 32.36 kg/m   Constitutional:  ECOG normal  HEENT:  wnl  Lymphatic:  no adenopathy.  Resp:  normal effort  CV:  normal pulses, no peripheral edema  Abdomen:  no abdominal masses, no CVA tenderness and no hernias noted.  GU Exam:       circumcised penis. normal meatus with normal calibur,the penile shaft has some palpable plaque distally on the left-hand side     Testes descended bilaterally, Epidimymis normal to palpation,     Perineum normal to visual inspection. Sphincter with normal tone,  Prostate Nontender  approximately 40 grams in size with no nodularity  Extremities:  no cyanosis.  no edema.  Neuro/Psych: affect normal .    Labs Reviewed today with the patient include:  Office Visit on 03/06/2016   Component Date Value Ref Range Status   . Prostate Specific Antigen 03/06/2016 1.89  0.00 - 4.00 ng/mL Final    Comment:   by [employer] on [facility] DxI.       Values above 4 ng/mL may suggest the   presence of prostate cancer.  PSA may also be elevated in patients with benign   prostatic hypertrophy or inflammatory conditions of the prostate.     . Hemoglobin A1C 03/06/2016 8.5* 4.0 - 6.0 % Final   . Sodium 03/06/2016 140  135 - 145 meq/L Final   . Potassium 03/06/2016 4.4  3.6 - 5.2 meq/L Final   . Chloride 03/06/2016 101  98 - 108 meq/L Final   . Carbon Dioxide, Total 03/06/2016 29  22 - 32 meq/L Final   . Anion Gap 03/06/2016 10  4 - 12 Final   . Glucose 03/06/2016 203* 62 - 125 mg/dL Final   . Urea Nitrogen 03/06/2016 23* 8 - 21 mg/dL Final   . Creatinine 09/60/454002/22/2018 1.04  0.51 - 1.18 mg/dL Final   . Protein (Total) 03/06/2016 6.6  6.0 - 8.2 g/dL Final   . Albumin 98/11/914702/22/2018 4.6  3.5 - 5.2 g/dL Final   . Bilirubin (Total) 03/06/2016 1.2  0.2 - 1.3 mg/dL Final   .  Calcium 03/06/2016 9.9  8.9 - 10.2 mg/dL Final   . AST (GOT) 16/10/9602 23  9 - 38 U/L Final   . Alkaline Phosphatase (Total) 03/06/2016 60  37 - 159 U/L Final   . ALT (GPT) 03/06/2016 39  10 - 48 U/L Final   . GFR, Calc, European American 03/06/2016 >60  >59 mL/min/[1.73_m2] Final    . GFR, Calc, African American 03/06/2016 >60  >59 mL/min/[1.73_m2] Final   . GFR, Information 03/06/2016 Calculated GFR in mL/min/1.73 m2 by MDRD equation.  Inaccurate with changing renal function.  See http://depts.ThisTune.it.html   Final   . Creatine Kinase Total Activity 03/06/2016 91  62 - 325 U/L Final   . WBC 03/06/2016 7.03  4.3 - 10.0 10*3/uL Final   . RBC 03/06/2016 5.33  4.40 - 5.60 10*6/uL Final   . Hemoglobin 03/06/2016 16.0  13.0 - 18.0 g/dL Final   . Hematocrit 54/09/8117 47  38 - 50 % Final   . MCV 03/06/2016 89  81 - 98 fL Final   . MCH 03/06/2016 30.0  27.3 - 33.6 pg Final   . MCHC 03/06/2016 33.8  32.2 - 36.5 g/dL Final   . Platelet Count 03/06/2016 276  150 - 400 10*3/uL Final   . RDW-CV 03/06/2016 13.2  11.6 - 14.4 % Final   . % Neutrophils 03/06/2016 70  % Final   . % Lymphocytes 03/06/2016 19  % Final   . % Monocytes 03/06/2016 9  % Final   . % Eosinophils 03/06/2016 1  % Final   . % Basophils 03/06/2016 1  % Final   . % Immature Granulocytes 03/06/2016 0  % Final   . Neutrophils 03/06/2016 4.96  1.80 - 7.00 10*3/uL Final   . Absolute Lymphocyte Count 03/06/2016 1.32  1.00 - 4.80 10*3/uL Final   . Monocytes 03/06/2016 0.61  0.00 - 0.80 10*3/uL Final   . Absolute Eosinophil Count 03/06/2016 0.07  0.00 - 0.50 10*3/uL Final   . Basophils 03/06/2016 0.04  0.00 - 0.20 10*3/uL Final   . Immature Granulocytes 03/06/2016 0.03  0.00 - 0.05 10*3/uL Final   . Nucleated RBC 03/06/2016 0.00  0.00 10*3/uL Final   . % Nucleated RBC 03/06/2016 0  % Final   . Color, URN 03/06/2016 Yellow   Final   . Appearance, URN 03/06/2016 Clear   Final   . Specific Gravity, URN 03/06/2016 >1.029  1.005 - 1.030 Final   . pH, URN 03/06/2016 5.5  5 - 8 Final   . Protein (Alb Semiquant), URN 03/06/2016 Negative  NRN Final   . Glucose (Qual), URN 03/06/2016 >1000 (3+)* NRN mg/dL Final   . Ketones, URN 03/06/2016 15 (1+)* NRN mg/dL Final   . Bilirubin (Qual), URN 03/06/2016 Negative  NRN Final   .  Occult Blood, URN 03/06/2016 Negative  NRN Final   . Urobilinogen, URN 03/06/2016 0.1-1.9  URONML [Ehrlich'U] Final   . Leukocyte Esterase, URN 03/06/2016 Negative  NRN Final   . Nitrites POC, URN 03/06/2016 Negative  NRN Final   . WBC, URN 03/06/2016 0-5(NEG)  Z5NEG /[HPF] Final   . RBC, URN 03/06/2016 0-2(NEG)  Z2NEG /[HPF] Final   . Bacteria, URN 03/06/2016 Not Seen  NOSEEN Final   . Epith Cells_Squamous, URN 03/06/2016 0-5(NEG)  JY7 /[LPF] Final   . Epith Cells_Renal/Trans,URN 03/06/2016 <3(NEG)  WGNF6 /[HPF] Final   . Comments For Microscopic, URN 03/06/2016 None  NONE Final   . Special Requests 03/06/2016 None   Final   .  Culture 03/06/2016    Final                    Value:2000  Col/mL  Mixed gram positive flora  at least 3 different colony types           ASSESSMENT/PLAN:   Sabre Leonetti is a 66 year old male with Some urinary frequency and incomplete emptying.  His postvoid residuals 175 mL.  He also has significant amounts of sugar in his urine.  He is working with his other physicians to get better control of his glucose levels.  I think we should try some alpha blockade just to see if it helps him.  He also would like some sildenafil.  I would like to see him back in 1 month to see if the tamsulosin improves his postvoid residuals.  We discussed his Peyronie's disease but no intervention is necessary at this time.

## 2016-03-31 NOTE — Telephone Encounter (Signed)
Please call [facility]. They have questions regarding Sildenafil Citrate 20 MG Oral Tab.  Pharmacist can be reached at [phone]

## 2016-04-02 ENCOUNTER — Telehealth (INDEPENDENT_AMBULATORY_CARE_PROVIDER_SITE_OTHER): Payer: Self-pay

## 2016-04-02 DIAGNOSIS — E118 Type 2 diabetes mellitus with unspecified complications: Secondary | ICD-10-CM

## 2016-04-02 MED ORDER — METFORMIN HCL 1000 MG OR TABS
ORAL_TABLET | ORAL | 6 refills | Status: DC
Start: 2016-04-02 — End: 2016-11-06

## 2016-04-02 NOTE — Telephone Encounter (Signed)
refill 

## 2016-04-08 ENCOUNTER — Ambulatory Visit: Payer: No Typology Code available for payment source

## 2016-04-08 ENCOUNTER — Encounter (INDEPENDENT_AMBULATORY_CARE_PROVIDER_SITE_OTHER): Payer: Self-pay

## 2016-04-08 ENCOUNTER — Other Ambulatory Visit: Payer: Self-pay

## 2016-04-08 ENCOUNTER — Ambulatory Visit (INDEPENDENT_AMBULATORY_CARE_PROVIDER_SITE_OTHER): Payer: No Typology Code available for payment source

## 2016-04-08 VITALS — BP 130/70 | HR 73 | Ht 71.0 in | Wt 232.0 lb

## 2016-04-08 DIAGNOSIS — S29019A Strain of muscle and tendon of unspecified wall of thorax, initial encounter: Secondary | ICD-10-CM | POA: Insufficient documentation

## 2016-04-08 DIAGNOSIS — Z6832 Body mass index (BMI) 32.0-32.9, adult: Secondary | ICD-10-CM

## 2016-04-08 NOTE — Patient Instructions (Signed)
Seek immediate attention if symptoms are worsening. Call if symptoms are not resolving with therapy.    A letter will be coming within the next two weeks reviewing the lab results.    Best wishes,    Dr. Oluwatomisin Deman

## 2016-04-08 NOTE — Progress Notes (Signed)
DATE: 04/08/2016     PHYSICIAN NOTE:  Patient Name: William Bryant  Medical Record Number: [id number]  Visit Date/time: 04/08/2016     Date of Birth: December 31, 1950    CHIEF COMPLAINT:    Chief Complaint   Patient presents with   . Musculoskeletal Problem     mid back pain       William Bryant is a 66 year old male who is here today due to back pain.     PROBLEMS TO BE ADDRESSED ON TODAYS VISIT:  (S29.019A) Thoracic myofascial strain, initial encounter  (primary encounter diagnosis)    HPI:  History provided by patient.    William Bryant is a 66 year old male who is here today due to back pain, onset one month ago. The patient denies any clear inciting event, injury, or trauma to the back. Pain is located in the middle back and exacerbated with movement, rotation of the torso, and coughing.       Past Medical History:  As cited in EHR, updated and reviewed by myself on 04/08/2016    Family History:  As cited in EHR, updated and reviewed by myself on 04/08/2016    Medications/Allergies:  As cited in EHR, updated and reviewed by myself on 04/08/2016      REVIEW OF SYSTEMS:     Male    CONSTITUTIONAL: Denies, fatigue, unexpected weight loss, unexpected weight gain  RHEUMATOLOGIC/MSK: Reports thoracic back pain    PHYSICAL EXAM:  BP 130/70   Pulse 73   Ht 5\' 11"  (1.803 m)   Wt (!) 232 lb (105.2 kg)   BMI 32.36 kg/m   Physical Exam:  Vital signs as reported above.    Skin: Warm, dry to touch    Head: Normocephalic, atraumatic    Eyes: EOMI, PERLA    Ears: TM's clear bilaterally    Nose: Septum intact, no lesions, pink mucosa and turbinates, no discharge    Pharynx: Clear    Neck: Supple, without JVD  or goiter    Chest: Clear     CV: Heart: RRR, no murmur, no rub, no S3, S4 noted    Extrem: no edema    MSK: tenderness in the right mid thoracic paraspinous musculature     Neuro: intact      ASSESSMENT AND PLAN:  Pertinent Labs & Imaging studies reviewed. (See chart for details)  Prior EMR records  reviewed in EPIC as available and clinically relevant.    1. Thoracic myofascial strain, initial encounter  Patient complains of mid back pain, onset one month ago. On examination, there was tenderness in the right mid thoracic paraspinous musculature. I ordered a thoracic spine film and referred the patient to physical therapy for further evaluation.   - XR T SPINE 2 VW  - REFERRAL TO PHYSICAL THERAPY      This appointment took 15 minutes and more than 50% was spent in counseling and/or coordination of care.     Further treatment dependant on the results of the tests.    Seek immediate attention if symptoms are worsening.    Call if symptoms are not resolving with therapy.    Portions of this chart were written by Emelia LoronKarin Muggli, Medical Scribe with oversight by Katy FitchJoel Hagedorn, MD 04/08/2016 8:43 AM    I, Katy FitchJoel Hagedorn, have reviewed the information recorded by the scribe for accuracy and agree with its contents.    This medical record has been electronically generated with voice recognition  software. Every attempt is made to assure accuracy.   However. it may contain errors related to this process.

## 2016-04-08 NOTE — Progress Notes (Signed)
I, Katy FitchJoel Estefania Kamiya, MD interviewed and examined the patient while overseeing the documentation performed by my Medical Scribe, Emelia LoronKarin Muggli. I have reviewed and revised as necessary the scribe's note and agree with the documented findings and plan of care. Please refer to the scribe's note below for further detailed information regarding the patient encounter and exam.  04/08/2016. 11:50 AM.

## 2016-04-15 ENCOUNTER — Telehealth (INDEPENDENT_AMBULATORY_CARE_PROVIDER_SITE_OTHER): Payer: Self-pay

## 2016-04-15 DIAGNOSIS — S29019A Strain of muscle and tendon of unspecified wall of thorax, initial encounter: Secondary | ICD-10-CM

## 2016-04-15 NOTE — Telephone Encounter (Signed)
William Bryant-I was told that the [place] [facility] will not accept his insurance.  Is there a system within the [facility] physical therapy providers that would accept his insurance?

## 2016-04-16 NOTE — Telephone Encounter (Signed)
[facility] takes his insurance.   Please complete and sign referral and send back.   Pt will need to be notified of their phone number.

## 2016-04-16 NOTE — Addendum Note (Signed)
Addended by: Consuello Bossier on: 04/16/2016 11:47 AM     Modules accepted: Orders

## 2016-04-16 NOTE — Addendum Note (Signed)
Addended by: Perrin Maltese on: 04/16/2016 12:04 PM     Modules accepted: Orders

## 2016-04-16 NOTE — Telephone Encounter (Signed)
Kriste Basque is working on this. Please don't take.

## 2016-04-22 ENCOUNTER — Ambulatory Visit: Payer: No Typology Code available for payment source

## 2016-04-22 DIAGNOSIS — E119 Type 2 diabetes mellitus without complications: Secondary | ICD-10-CM | POA: Insufficient documentation

## 2016-04-24 ENCOUNTER — Encounter (INDEPENDENT_AMBULATORY_CARE_PROVIDER_SITE_OTHER): Payer: Self-pay

## 2016-04-29 ENCOUNTER — Encounter (INDEPENDENT_AMBULATORY_CARE_PROVIDER_SITE_OTHER): Payer: Self-pay

## 2016-05-01 ENCOUNTER — Encounter (INDEPENDENT_AMBULATORY_CARE_PROVIDER_SITE_OTHER): Payer: Self-pay

## 2016-05-05 ENCOUNTER — Encounter (INDEPENDENT_AMBULATORY_CARE_PROVIDER_SITE_OTHER): Payer: Self-pay | Admitting: Urology

## 2016-05-05 ENCOUNTER — Ambulatory Visit (INDEPENDENT_AMBULATORY_CARE_PROVIDER_SITE_OTHER): Payer: No Typology Code available for payment source | Admitting: Urology

## 2016-05-05 VITALS — BP 142/82 | HR 72 | Ht 71.0 in | Wt 229.0 lb

## 2016-05-05 DIAGNOSIS — N486 Induration penis plastica: Secondary | ICD-10-CM

## 2016-05-05 DIAGNOSIS — R350 Frequency of micturition: Secondary | ICD-10-CM

## 2016-05-05 DIAGNOSIS — N529 Male erectile dysfunction, unspecified: Secondary | ICD-10-CM

## 2016-05-05 HISTORY — DX: Induration penis plastica: N48.6

## 2016-05-05 LAB — PR MEAS POST-VOIDING RESIDUAL URINE&/BLADDER CAP

## 2016-05-05 NOTE — Progress Notes (Signed)
UROLOGY CLINIC NOTE    Reason for Visit:   Chief Complaint   Patient presents with    Urine Problem     4 week freq. f/y with PVR     History of Present Illness: William Bryant is a 66 year old male who returns to me today.  He has less frequency and less nocturia since starting tamsulosin.        A 14 system review of systems was completed by the patient and was reviewed and includes no changes from last visit other than he is exercising more.   Past Medical Hx:    Past Medical History:   Diagnosis Date    BPH (benign prostatic hyperplasia)     Diabetes mellitus (HCC)     Diverticulosis     History of BPH     History of erectile dysfunction     Hypertension     Lower GI bleed      Past Surgical Hx:    Past Surgical History:   Procedure Laterality Date    knee replacement x2       Past Family and Social Hx:  Family History     Problem (# of Occurrences) Relation (Name,Age of Onset)    Breast Cancer (1) Sister    Cancer (1) Father: patient believes colon cancer    Deep Vein Thrombosis (1) Father    Heart Disease (1) Mother    Myocardial Infarction (1) Other: grandfather     Prostate Cancer (1) Father        He reports that he quit smoking about 17 years ago. His smoking use included Cigarettes. He quit after 22.00 years of use. He has never used smokeless tobacco. He reports that he drinks alcohol. He reports that he does not use drugs.    Active Meds:   Outpatient Prescriptions Marked as Taking for the 05/05/16 encounter (Office Visit) with Gwenlyn Fudge, MD   Medication Sig Dispense Refill    Aspirin 81 MG Oral Chew Tab Take 81 mg by mouth every day.      Benazepril HCl 5 MG Oral Tab Take 1 tablet (5 mg) by mouth daily. Take 1 Tab by mouth daily at bedtime. To prevent kidney damage. 90 tablet 12    Cholecalciferol (D 2000) 2000 UNITS Oral Tab Take 1 Tab by mouth every day.      Glimepiride 2 MG Oral Tab Take 1 tablet (2 mg) by mouth daily. (Patient taking differently: Take 4 mg by  mouth daily. ) 60 tablet 6    MetFORMIN HCl 1000 MG Oral Tab Sig 1 tablet q am and 1.5 tablets q pm with meals 75 tablet 6    Simvastatin 20 MG Oral Tab Take 1 tablet (20 mg) by mouth daily. 30 tablet 6    Tamsulosin HCl 0.4 MG Oral Cap Take 1 capsule (0.4 mg) by mouth daily. 30 capsule 11     Allergies: Review of patient's allergies indicates:  No Known Allergies    OBJECTIVE:  Physical Exam: BP 142/82    Pulse 72    Ht  (1.803 m)    Wt (!) 229 lb (103.9 kg)    BMI 31.94 kg/m   Constitutional: WDWN, pleasant and appropriate affect and no acute distress    ASSESSMENT/PLAN:    He will continue tamsulosin.  We discussed his ED and Peyronie's.  He has not yet tried sildenafil.  Total time of visit was approximately 25 minutes of which >  50% was spent in counseling and or coordination of care.

## 2016-05-06 ENCOUNTER — Ambulatory Visit: Payer: No Typology Code available for payment source

## 2016-05-06 ENCOUNTER — Encounter (INDEPENDENT_AMBULATORY_CARE_PROVIDER_SITE_OTHER): Payer: Self-pay

## 2016-05-06 DIAGNOSIS — E119 Type 2 diabetes mellitus without complications: Secondary | ICD-10-CM | POA: Insufficient documentation

## 2016-05-08 ENCOUNTER — Encounter (INDEPENDENT_AMBULATORY_CARE_PROVIDER_SITE_OTHER): Payer: Self-pay

## 2016-05-14 ENCOUNTER — Encounter (INDEPENDENT_AMBULATORY_CARE_PROVIDER_SITE_OTHER): Payer: Self-pay

## 2016-05-16 ENCOUNTER — Ambulatory Visit (INDEPENDENT_AMBULATORY_CARE_PROVIDER_SITE_OTHER): Payer: No Typology Code available for payment source

## 2016-05-16 VITALS — BP 122/59 | HR 84 | Temp 98.2°F

## 2016-05-16 DIAGNOSIS — M9902 Segmental and somatic dysfunction of thoracic region: Secondary | ICD-10-CM | POA: Insufficient documentation

## 2016-05-16 NOTE — Progress Notes (Signed)
[facility]  Physical Therapy Initial Evaluation    William Bryant  Today's date: 05/16/2016  Onset / Surgical Date: 01/2016  Referring Provider: Debe CoderJoel Steven Hagedorn    Time In: 4:00 Time Out: 5:04 Total Time: 64 Minutes  Treatment Diagnosis:   Encounter Diagnosis   Name Primary?    Thoracic segment dysfunction Yes         SUBJECTIVE:   William Bryant is a 66 year old male who states that since January, she started having pain on his mid back. Increase in pain noted with twisting motion as well as rolling in bed.  The patient currently reports 7/10 pain described as achy and radiates into lower back. The patient is working as an Engineer, technical salesinterpretor.   Past Medical History:   Diagnosis Date    BPH (benign prostatic hyperplasia)     Diabetes mellitus (HCC)     Diverticulosis     History of BPH     History of erectile dysfunction     Hypertension     Lower GI bleed     Peyronie disease 05/05/2016      Past Surgical History:   Procedure Laterality Date    knee replacement x2        The patient's recreational activities include:   The patient's current goals: pain free    OBJECTIVE:  Vitals:    BP 122/59    Pulse 84    Temp 98.2 F (36.8 C)    SpO2 97%        Postural Examination: Increased T/S kyphosis     AROM: T/S      Comment   Flexion 0-20      Extension 0-10    Left Side Bend 0-25    Right Side Bend 0-15 pain    Left Rotation 0-30    Right Rotation 0-20 pain             MMT: Manual Muscle Test for Thoracic Spine: (Values scored on a 0-5 scale)     Right side  Left Side Comment   T/S Flexion  4-/5     T/S Extension  4/5     T/S Rotation 4/5  4/5    T/S Side Bend 4-/5  4/5               PALPATION: mild tenderness around T7-8 paraspinals. Palpable deposit around T6-8 area.    Oswestry  The patient self scores 9/50; 0/50 = Full function without difficulty    JOINT PLAY:   Gr.2 hypomobility at T7/8 R. Rotation and extension    IMAGING REVIEW:       TREATMENT:   Today's  appointment consisted of the initial evaluation. The patient was educated regarding physical therapy diagnosis, anatomy, body mechanics, and the plan of care. From this conversation, the patient agrees to the plan of care. Treatment then consisted of Therapeutic exercise 10 minutes, manual therapy for 10 minutes on T/S as well as neuromuscular re-ed for 10 minutes. The patient was given an extensive home exercise program to increase vascularity, segmental mobility as well as postural awareness. Electrical stimulation with ice x 15 minutes was performed intensity to tolerance with an interferential setting to the T/S to help reduce soreness and pain.      ASSESSMENT:   William Bryant presents with signs and symptoms consistent with the referring diagnosis. It is felt that he shows limited segmental mobility as well as poor segmental control of T/S. Skilled physical  therapy is appropriate and their prognosis at this time is rated as good. Their evaluation is rated as moderate in complexity secondary to the following factors:      3  personal factors and co-morbidities  5 examination of body system(s) including 3  elements   Evolving  clinical presentation  Moderate  level of clinical decision making   Typical face-to-face time of 45+  minutes    PLAN:   Physical therapy will consist of 1-2 visits per week, for 5-6 weeks. Total number of visits for the totality of physical therapy is estimated to be 10 visits at this time. Physical therapy visits will consist of 60-90 minute sessions consisting of Manual therapy, Therapeutic exercise, Neuromuscular re-education, Functional activity training, Recreational activity progression, modification of HEP, and modalities as needed.    Short Term Goals: In the next 2-4 weeks, it is expected that the patient will be able to:   1) Turn his body without increase in pain while driving.    2) Reaching [place] with right arm without increase in pain    Long Term Goals: By the end of physical  therapy, it is expected that the patient will:   1) be independent with their HEP   2) self score 0/50 on the Oswestry    Thank you Debe Coder Hagedorn for the referral of your patient, William Bryant. If you have any questions or concerns regarding their plan of care or this report, please contact me at the office.     Electrically signed,   Rolene Course, MPT, COMT, FAAOMPT    [facility]

## 2016-05-19 ENCOUNTER — Telehealth (INDEPENDENT_AMBULATORY_CARE_PROVIDER_SITE_OTHER): Payer: Self-pay

## 2016-05-19 ENCOUNTER — Encounter (INDEPENDENT_AMBULATORY_CARE_PROVIDER_SITE_OTHER): Payer: Self-pay

## 2016-05-19 ENCOUNTER — Ambulatory Visit (INDEPENDENT_AMBULATORY_CARE_PROVIDER_SITE_OTHER): Payer: No Typology Code available for payment source

## 2016-05-19 ENCOUNTER — Other Ambulatory Visit (INDEPENDENT_AMBULATORY_CARE_PROVIDER_SITE_OTHER): Payer: Self-pay

## 2016-05-19 DIAGNOSIS — M25512 Pain in left shoulder: Secondary | ICD-10-CM

## 2016-05-19 DIAGNOSIS — M9902 Segmental and somatic dysfunction of thoracic region: Secondary | ICD-10-CM

## 2016-05-19 DIAGNOSIS — E782 Mixed hyperlipidemia: Secondary | ICD-10-CM
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
## 2016-05-19 NOTE — Telephone Encounter (Signed)
This was handled on a prior TE.

## 2016-05-19 NOTE — Progress Notes (Signed)
[facility]  Daily Treatment Note    Bo Merinondrew William Bang  Today's date: 05/19/2016  Referring Provider: Debe CoderJoel Steven Hagedorn    Time In: 4:07 Time Out: 5:10 Total Time: 63 minutes  Treatment Diagnosis:   Encounter Diagnosis   Name Primary?    Thoracic segment dysfunction          SUBJECTIVE:   Greig Castillandrew states that he is feeling a little better. He can turn with less pain.      OBJECTIVE:   AROM T/S R. Rot 60% with min pain    TREATMENT:   Today's treatment consisted of Manual Therapy x 20 minutes using soft tissue mobilizations and joint mobilizations focusing on the T/S. Manual therapy performed to increase circulation, decrease tissue tightness, and reduce soreness. Therapeutic exercises x 13 minutes, along with neuromuscular re-education x 15 minutes as per flow sheet. Exercises performed to increase muscle strength, muscle coordination, movement quality, proprioception, exercise tolerance, and muscle endurance. Electrical stimulation with ice x 15 minutes was performed intensity to tolerance with an interferential setting to the T/S to help reduce soreness and pain.      ASSESSMENT:   The patient tolerated today's appointment good/normal. He shows much improved ROM post joint mob today.    PLAN:   The current physical therapy regimen is effective;  continue present plan of care. Modify exercises and plan as needed.     Electrically signed,   Rolene CourseKayoko Mayuri Staples, MPT, COMT, FAAOMPT    [facility]

## 2016-05-19 NOTE — Telephone Encounter (Signed)
Lease sign referral.

## 2016-05-20 MED ORDER — SIMVASTATIN 20 MG OR TABS
20.0000 mg | ORAL_TABLET | Freq: Every day | ORAL | 1 refills | Status: DC
Start: 2016-05-20 — End: 2016-11-06

## 2016-05-21 ENCOUNTER — Encounter (INDEPENDENT_AMBULATORY_CARE_PROVIDER_SITE_OTHER): Payer: Self-pay

## 2016-05-22 ENCOUNTER — Ambulatory Visit (INDEPENDENT_AMBULATORY_CARE_PROVIDER_SITE_OTHER): Payer: No Typology Code available for payment source

## 2016-05-22 DIAGNOSIS — M9902 Segmental and somatic dysfunction of thoracic region: Secondary | ICD-10-CM

## 2016-05-22 NOTE — Progress Notes (Signed)
[facility]  Daily Treatment Note    William Bryant  Today's date: 05/22/2016  Referring Provider: Debe CoderJoel Steven Bryant    Time In: 4:06 Time Out: 5:17 Total Time: 71 minutes  Treatment Diagnosis:   Encounter Diagnosis   Name Primary?    Thoracic segment dysfunction          SUBJECTIVE:   William Bryant states that he is feeling about the same. Still sore to turn to right.     OBJECTIVE:   AROM T/S Rot 60% pain    TREATMENT:   Today's treatment consisted of Manual Therapy x 20 minutes using soft tissue mobilizations and joint mobilizations focusing on the T/S. Manual therapy performed to increase circulation, decrease tissue tightness, and reduce soreness. Therapeutic exercises x 11 minutes, along with neuromuscular re-education x 25 minutes as per flow sheet. Exercises performed to increase muscle strength, muscle coordination, movement quality, proprioception, exercise tolerance, and muscle endurance. Electrical stimulation with ice x 15 minutes was performed intensity to tolerance with an interferential setting to the T/S to help reduce soreness and pain.      ASSESSMENT:   The patient tolerated today's appointment good/normal. He shows much improved ROM post joint mob.    PLAN:   The current physical therapy regimen is effective;  continue present plan of care. Modify exercises and plan as needed.     Electrically signed,   Rolene CourseKayoko Alece Koppel, MPT, COMT, FAAOMPT    [facility]

## 2016-05-29 ENCOUNTER — Ambulatory Visit (INDEPENDENT_AMBULATORY_CARE_PROVIDER_SITE_OTHER): Payer: No Typology Code available for payment source

## 2016-05-29 DIAGNOSIS — M9902 Segmental and somatic dysfunction of thoracic region: Secondary | ICD-10-CM

## 2016-05-29 NOTE — Progress Notes (Signed)
[facility]  Daily Treatment Note    Bo Merinondrew William Cheek  Today's date: 05/29/2016  Referring Provider: Debe CoderJoel Steven Hagedorn    Time In: 3:57 Time Out: 5:08 Total Time: 71 minutes  Treatment Diagnosis:   Encounter Diagnosis   Name Primary?    Thoracic segment dysfunction          SUBJECTIVE:   William Castillandrew states that he was feeling ok until 2 days ago. But now pain is back with rotation to right.      OBJECTIVE:   AORM T/S R. Rot 60%  Mobility:T7/8 Gr.2 into extension and R. Rot  Rib 7-8 Gr.2 hypomobility    TREATMENT:   Today's treatment consisted of Manual Therapy x 20 minutes using soft tissue mobilizations and joint mobilizations focusing on the T/S. Manual therapy performed to increase circulation, decrease tissue tightness, and reduce soreness. Therapeutic exercises x 25 minutes, along with neuromuscular re-education x 11 minutes as per flow sheet. Exercises performed to increase muscle strength, muscle coordination, movement quality, proprioception, exercise tolerance, and muscle endurance. Electrical stimulation with ice x 15 minutes was performed intensity to tolerance with an interferential setting to the T/S to help reduce soreness and pain.      ASSESSMENT:   The patient tolerated today's appointment good/normal. He shows increased ROM post joint mob.     PLAN:   The current physical therapy regimen is effective;  continue present plan of care. Modify exercises and plan as needed.     Electrically signed,   Rolene CourseKayoko Daelynn Blower, MPT, COMT, FAAOMPT    [facility]

## 2016-06-03 ENCOUNTER — Encounter (INDEPENDENT_AMBULATORY_CARE_PROVIDER_SITE_OTHER): Payer: Self-pay

## 2016-06-05 ENCOUNTER — Encounter (INDEPENDENT_AMBULATORY_CARE_PROVIDER_SITE_OTHER): Payer: Self-pay

## 2016-06-12 ENCOUNTER — Ambulatory Visit (INDEPENDENT_AMBULATORY_CARE_PROVIDER_SITE_OTHER): Payer: No Typology Code available for payment source

## 2016-06-12 DIAGNOSIS — M9902 Segmental and somatic dysfunction of thoracic region: Secondary | ICD-10-CM

## 2016-06-12 NOTE — Progress Notes (Signed)
[facility]  Daily Treatment Note    Bo Merinondrew William Bruneau  Today's date: 06/12/2016  Referring Provider: Debe CoderJoel Steven Hagedorn    Time In: 3:52 Time Out: 5:09 Total Time: 77 minutes  Treatment Diagnosis:   Encounter Diagnosis   Name Primary?    Thoracic segment dysfunction          SUBJECTIVE:   William Castillandrew states that he is feeling a little better, but still there's some pain with twisting motion.     OBJECTIVE:   AROM T/S L. SB and R. Rot with 80% and pain    TREATMENT:   Today's treatment consisted of one on one Manual Therapy x 20 minutes using soft tissue mobilizations and joint mobilizations focusing on the T/S and ribs with breathing technique and neuromuscular Re-education for 10 minutes. Manual therapy performed to increase circulation, decrease tissue tightness, and reduce soreness. With direct supervision, PT aide managed Therapeutic exercises x 27 minutes, along with neuromuscular re-education x 5 minutes as per flow sheet. Exercises performed to increase muscle strength, muscle coordination, movement quality, proprioception, exercise tolerance, and muscle endurance. Electrical stimulation with ice x 15 minutes was performed intensity to tolerance with an interferential setting to the T/S to help reduce soreness and pain.      ASSESSMENT:   The patient tolerated today's appointment good/normal. He shows improved ROM post SNAGs and manual therapy.     PLAN:   The current physical therapy regimen is effective;  continue present plan of care. Modify exercises and plan as needed. He'll be independent with HEP for a few weeks and return as needed.    Electrically signed,   Rolene CourseKayoko Cherise Fedder, MPT, COMT, FAAOMPT    [facility]

## 2016-06-16 ENCOUNTER — Encounter (INDEPENDENT_AMBULATORY_CARE_PROVIDER_SITE_OTHER): Payer: Self-pay

## 2016-07-07 ENCOUNTER — Ambulatory Visit (INDEPENDENT_AMBULATORY_CARE_PROVIDER_SITE_OTHER): Payer: No Typology Code available for payment source

## 2016-07-07 DIAGNOSIS — M9902 Segmental and somatic dysfunction of thoracic region: Secondary | ICD-10-CM

## 2016-07-07 NOTE — Progress Notes (Signed)
[facility]  Daily Treatment Note    William Bryant  Today's date: 07/07/2016  Referring Provider: Debe CoderJoel Steven Hagedorn    Time In: 3:53 Time Out: 4:54 Total Time: 59 minutes  Treatment Diagnosis:   Encounter Diagnosis   Name Primary?   . Thoracic segment dysfunction          SUBJECTIVE:   William Bryant states that he is feeling ok. He has some  Good days and bad days, but overall there's some progress.     OBJECTIVE:   AROM T/S R. Rot with 80% and pain, much improved post joint mob and exercises to 90% and min pain    TREATMENT:   Today's treatment consisted of one on one Manual Therapy x 15 minutes using soft tissue mobilizations and joint mobilizations focusing on the T/S and neuromuscular Re-education for 10 minutes and one on one therapeutic exercises for 15 minutes. Manual therapy performed to increase circulation, decrease tissue tightness, and reduce soreness. With direct supervision, PT aide managed Therapeutic exercises x 19 minutes,  as per flow sheet. Exercises performed to increase muscle strength, muscle coordination, movement quality, proprioception, exercise tolerance, and muscle endurance.     ASSESSMENT:   The patient tolerated today's appointment good/normal. He shows much improved ROM with manual therapy.     PLAN:   He will be independent with HEP and return as needed. Otherwise he is discharged.     Electrically signed,   Rolene CourseKayoko Claudette Wermuth, MPT, COMT, FAAOMPT    [facility]

## 2016-07-24 ENCOUNTER — Ambulatory Visit (INDEPENDENT_AMBULATORY_CARE_PROVIDER_SITE_OTHER): Payer: No Typology Code available for payment source | Admitting: Family Medicine

## 2016-07-24 ENCOUNTER — Encounter (INDEPENDENT_AMBULATORY_CARE_PROVIDER_SITE_OTHER): Payer: Self-pay | Admitting: Family Medicine

## 2016-07-24 VITALS — BP 120/64 | HR 94 | Ht 71.5 in | Wt 220.0 lb

## 2016-07-24 DIAGNOSIS — M19012 Primary osteoarthritis, left shoulder: Secondary | ICD-10-CM

## 2016-07-24 DIAGNOSIS — M67912 Unspecified disorder of synovium and tendon, left shoulder: Secondary | ICD-10-CM

## 2016-07-24 DIAGNOSIS — M7502 Adhesive capsulitis of left shoulder: Secondary | ICD-10-CM

## 2016-07-24 DIAGNOSIS — Z6830 Body mass index (BMI) 30.0-30.9, adult: Secondary | ICD-10-CM

## 2016-07-24 MED ORDER — BETAMETHASONE SOD PHOS & ACET 6 (3-3) MG/ML IJ SUSP
6.0000 mg | Freq: Once | INTRAMUSCULAR | Status: AC
Start: 2016-07-24 — End: 2016-07-24
  Administered 2016-07-24: 6 mg via INTRA_ARTICULAR

## 2016-07-24 NOTE — Progress Notes (Signed)
07/24/2016    Chief Complaint   Patient presents with   . Shoulder Pain     Recheck L shoulder. Requesting another cortisone injection. Cortisone injection helped 6 weeks.Finished 4 to 6 week of PT after his visit in Dec. Pain 3 to 4/10. Worse at night.        Mason Dibiasio returns for followup of [id number]) Arthrosis of left acromioclavicular joint  (primary encounter diagnosis)  Plan: injected 12/18 with decent results but can tell it has worn off.    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       Current Outpatient Prescriptions   Medication Sig Dispense Refill   . Aspirin 81 MG Oral Chew Tab Take 81 mg by mouth every day.     . Benazepril HCl 5 MG Oral Tab Take 1 tablet (5 mg) by mouth daily. Take 1 Tab by mouth daily at bedtime. To prevent kidney damage. 90 tablet 12   . Cholecalciferol (D 2000) 2000 UNITS Oral Tab Take 1 Tab by mouth every day.     . Glimepiride 2 MG Oral Tab Take 1 tablet (2 mg) by mouth daily. (Patient taking differently: Take 4 mg by mouth daily. ) 60 tablet 6   . MetFORMIN HCl 1000 MG Oral Tab Sig 1 tablet q am and 1.5 tablets q pm with meals 75 tablet 6   . Simvastatin 20 MG Oral Tab Take 1 tablet (20 mg) by mouth daily. 90 tablet 1   . Tamsulosin HCl 0.4 MG Oral Cap Take 1 capsule (0.4 mg) by mouth daily. 30 capsule 11     Current Facility-Administered Medications   Medication Dose Route Frequency Provider Last Rate Last Dose   . BETAmethasone Sod Phos & Acet (Celestone  Soluspan) 6 (3-3) mg/mL injection  6 mg Intra-arTICular Once Lolita Patella, DO             14 point review of systems is reviewed and unchanged from last visit.  Noncontributory to CC.    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  Plan: ARTHROCENTESIS ASPIR&/INJ MAJOR JT/BURSA W/O         [place], BETAmethasone Sod Phos & Acet (Celestone         Soluspan) 6 (3-3) mg/mL injection  Minor improvement    (V78.469(M67.912) Disorder of left rotator cuff  Plan: ARTHROCENTESIS ASPIR&/INJ MAJOR JT/BURSA W/O         US, BETAmethasone Sod Phos & Acet (Celestone         Soluspan) 6 (3-3) mg/mL injection        Partial tear and labral tear    PROCEDURE NOTE: (Intra articular left shoulder injection)     After the risks and benefits of the procedure were clearly explained to the patient and informed consent was obtained, the patient was placed seated on the table sitting with the arm slightly externally rotation. The skin overlying the coracoid process was identified.  The area was sterilly prepped.   The coracoid process and the humeral head were identified.  Taking a lateral to medial approached the needle was directed toward the medial aspect of the humeral head. Once in the proper position, 1 of celestone, 1 cc of plain lidocaine, and 1 cc of plain marcaine were injected into the glenohumeral joint using a 25g 1.5in needle.  The patient tolerated the procedure well.  There were no complications.    Continue stretching  Recheck 3 months  If no long term progress consider surgical consult          MDS

## 2016-09-04 ENCOUNTER — Encounter (INDEPENDENT_AMBULATORY_CARE_PROVIDER_SITE_OTHER): Payer: Self-pay | Admitting: Family Medicine

## 2016-09-04 ENCOUNTER — Ambulatory Visit (INDEPENDENT_AMBULATORY_CARE_PROVIDER_SITE_OTHER): Payer: No Typology Code available for payment source | Admitting: Family Medicine

## 2016-09-04 VITALS — BP 120/60 | HR 75 | Temp 98.5°F | Ht 71.5 in | Wt 229.6 lb

## 2016-09-04 DIAGNOSIS — M545 Low back pain, unspecified: Secondary | ICD-10-CM | POA: Insufficient documentation

## 2016-09-04 DIAGNOSIS — Z125 Encounter for screening for malignant neoplasm of prostate: Secondary | ICD-10-CM

## 2016-09-04 DIAGNOSIS — Z6831 Body mass index (BMI) 31.0-31.9, adult: Secondary | ICD-10-CM

## 2016-09-04 DIAGNOSIS — M25551 Pain in right hip: Secondary | ICD-10-CM

## 2016-09-04 DIAGNOSIS — M461 Sacroiliitis, not elsewhere classified: Secondary | ICD-10-CM | POA: Insufficient documentation

## 2016-09-04 DIAGNOSIS — M25552 Pain in left hip: Secondary | ICD-10-CM

## 2016-09-04 DIAGNOSIS — E119 Type 2 diabetes mellitus without complications: Secondary | ICD-10-CM

## 2016-09-04 HISTORY — DX: Pain in left hip: M25.552

## 2016-09-04 MED ORDER — KETOROLAC TROMETHAMINE 30 MG/ML IJ SOLN
15.0000 mg | Freq: Once | INTRAMUSCULAR | Status: AC
Start: 2016-09-04 — End: 2016-09-04
  Administered 2016-09-04: 15 mg via INTRAMUSCULAR

## 2016-09-04 MED ORDER — METHOCARBAMOL 500 MG OR TABS
500.0000 mg | ORAL_TABLET | Freq: Three times a day (TID) | ORAL | 0 refills | Status: DC | PRN
Start: 2016-09-04 — End: 2017-06-29

## 2016-09-04 NOTE — Progress Notes (Signed)
Does patient have 3 or more complaints:  NO  If yes, discuss with patient the need to schedule a follow up appointment due to limited appointment time.  Follow up appointment scheduled:  NO  Care Everywhere records available/ reconciled:  YES  PHQ2 complete:  YES  PHQ9 complete:  NO  Refills pended:  NO  Orders pended:  YES  Medications to discontinue:  NO    Health Maintenance updated:  YES    Health Maintenance   Topic Date Due    Diabetes Foot Exam  09/22/2014    Diabetes A1c  09/03/2016    Zoster Vaccine  03/06/2017 (Originally 03/28/2012)    Influenza Vaccine (1) 10/13/2016    Diabetes Eye Exam  10/17/2016    Depression Screening (PHQ-2)  03/06/2017    Colorectal Cancer Screening (Colonoscopy)  06/14/2018    Lipid Disorders Screening  06/24/2020    Tetanus Vaccine  11/14/2022    Hepatitis C Screening  Completed    HIV Screening  Completed

## 2016-09-04 NOTE — Patient Instructions (Addendum)
Patient Education     Arthralgia    Arthralgia is the term for pain in or around the joint. It is a symptom, not a disease. This pain may involve one or more joints. In some cases, the pain moves from joint to joint.  There are many causes for joint pain. These include:   Injury   Osteoarthritis (wearing out of the joint surface)   Gout (inflammation of the joint due to crystals in the joint fluid)   Infection inside the joint    Bursitis(inflammation of the fluid-filled sacs around the joint)   Autoimmune disorders such as rheumatoid arthritis or lupus   Tendonitis (inflammation of chords that attach muscle to bone)  Home care   Rest the involved joint(s) until your symptoms improve.   You may be prescribed pain medicine. If none is prescribed, you may use acetaminophen or ibuprofen to control pain and inflammation.  Follow-up care  Follow up with your healthcare provider or as advised.  When to seek medical advice  Contact your healthcare provider right away if any of the following occurs:   Pain,swelling, or redness ofjoint increases   Pain worsens or recurs after a period of improvement   Pain moves to other joints   You cannot bear weight on the affected joint   You cannot move the affected joint   Joint appears deformed   New rash appears   Fever of100.72F (38C)or higher, or as directed by your healthcare provider  Date Last Reviewed: 03/14/2015   2000-2017 [employer], [place]. [address], [place], [place] [address]. All rights reserved. This information is not intended as a substitute for professional medical care. Always follow your healthcare professional's instructions.           Patient Education     Causes of Lumbar (Low Back) Pain  Low back pain can be caused by problems with any part of the lumbar spine. A disk can herniate (push out) and press on a nerve. Vertebrae can rub against each other or slip out of place. This can irritate facet joints and nerves. It can  also lead to stenosis, a narrowing of the spinal canal or foramen.  Pressure from a disk  Constant wear and tear on a disk can cause it to weaken and push outward. Part of the disk may then press on nearby nerves. There are two common types of herniated disks:  Contained means the soft nucleus is protruding outward.   Extruded means the firm annulus has torn, letting the soft center squeeze through.     Pressure from bone An unstable spine   With age, a disk may thin and wear out. Vertebrae above and below the disk maybegin to touch. This can put pressure on nerves. It can also cause bone spurs (growths) to form where the bones rub together.    Stenosis results when bone spurs narrow the foramen or spinal canal. This also puts pressure on nerves. Slipping vertebrae can irritate nerves and joints. They can also worsen stenosis.    In some cases, vertebrae become unstable andslip forward. This is called spondylolisthesis.   Date Last Reviewed: 10/24/2013   2000-2017 [employer], [place]. [address] [place], [place] [address]. All rights reserved. This information is not intended as a substitute for professional medical care. Always follow your healthcare professional's instructions.           Patient Education     Possible Causes of Low Back or Leg  Pain    The symptoms in your back or leg may be due to pressure on a nerve. This pressure may be caused by a damaged disk or by abnormal bone growth. Either way, you may feel pain, burning, tingling, or numbness. If you have pressure on a nerve that connects to the sciatic nerve, pain may shoot down your leg.    Pressure from the disk  Constant wear and tear can weaken a disk over time and cause back pain. The disk can then be damaged by a sudden movement or injury. If its soft center begins to bulge, the disk may press on a nerve. Or the outside of the disk may tear, and the soft center may squeeze through and pinch a nerve.    Pressure from bone  As a disk  wears out, the vertebrae right above and below the disk begin to touch. This can put pressure on a nerve. Often, abnormal bone (called bone spurs) grows where the vertebrae rub against each other. This can cause the foramen or the spinal canal to narrow (called stenosis) and press against a nerve.  Date Last Reviewed: 10/16/2013   2000-2017 [employer], [place]. [address] [place], [place] [address]. All rights reserved. This information is not intended as a substitute for professional medical care. Always follow your healthcare professional's instructions.           Patient Education     How Your Back Works  A healthy back allows you to bend and stretch without pain. The spine has three natural curves, which keep your body balanced. Strong, flexible muscles support your spine. Soft, cushioning disks separate the hard bones of your spine, allowing it to bend and move.    The parts of the spine   The vertebrae are the 24 bones that make up the spine.   The spinous process is the part of each vertebra you can feel through your skin.   Each of these bones has a canal that runs top to bottom. Together these canals form a tunnel called the spinal canal.   The lamina of each vertebra forms the back of the spinal canal.   Running through the canal are nerves.   A foramen is a small opening where a nerve leaves the spinal canal.   Disks serve as cushions between vertebrae. A disks soft center absorbs shock during movement.     Two vertebrae and a disk     The supporting muscles  Strong, flexible muscles help maintain your three natural curves. They hold your spine in proper alignment. This helps support your upper body. Strong core muscular including thestomach, buttock, and thigh muscles help take the strain off your back.  Date Last Reviewed: 09/12/2013   2000-2017 [employer], [place]. [address], [place], [place] [address]. All rights reserved. This information is not intended as a substitute  for professional medical care. Always follow your healthcare professional's instructions.

## 2016-09-04 NOTE — Progress Notes (Signed)
Monofilament test was done and was normal.
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
## 2016-09-08 ENCOUNTER — Other Ambulatory Visit (INDEPENDENT_AMBULATORY_CARE_PROVIDER_SITE_OTHER): Payer: Self-pay | Admitting: Family Medicine

## 2016-09-08 ENCOUNTER — Ambulatory Visit: Payer: No Typology Code available for payment source | Attending: Family Medicine

## 2016-09-08 DIAGNOSIS — E119 Type 2 diabetes mellitus without complications: Secondary | ICD-10-CM

## 2016-09-08 LAB — LIPID PANEL
Cholesterol (LDL): 73 mg/dL (ref <130)
Cholesterol/HDL Ratio: 3.1
HDL Cholesterol: 47 mg/dL (ref >39)
Non-HDL Cholesterol: 98 mg/dL (ref 0–159)
Total Cholesterol: 145 mg/dL (ref <200)
Triglyceride: 124 mg/dL (ref <150)

## 2016-09-08 LAB — LAB ADD ON ORDER

## 2016-09-08 LAB — ALBUMIN/CREATININE RATIO, RANDOM URINE
Albumin (Micro), URN: 0.8 mg/dL
Albumin/Creatinine Ratio, URN: 11 mg/g{creat} (ref <30)
Creatinine/Unit, URN: 75 mg/dL

## 2016-09-08 LAB — PSA, DIAGNOSTIC/MONITORING: PSA, Diagnostic/Monitoring: 1.58 ng/mL (ref 0.00–4.00)

## 2016-09-08 NOTE — Addendum Note (Signed)
Addended by: Dannielle Burn DE [place] on: 09/08/2016 09:08 AM     Modules accepted: Orders

## 2016-09-09 ENCOUNTER — Telehealth (INDEPENDENT_AMBULATORY_CARE_PROVIDER_SITE_OTHER): Payer: Self-pay | Admitting: Family Medicine

## 2016-09-09 LAB — HEMOGLOBIN A1C, HPLC: Hemoglobin A1C: 7.2 % — ABNORMAL HIGH (ref 4.0–6.0)

## 2016-09-09 NOTE — Telephone Encounter (Signed)
Please inform patient  A1C 7.5: better but DM still uncontrolled    Recommend Office visit with PCP to follow up on results

## 2016-09-10 NOTE — Telephone Encounter (Signed)
I talked to the patient and told him the message.    The patient said that he still has pain, better but still there.

## 2016-10-27 ENCOUNTER — Encounter (INDEPENDENT_AMBULATORY_CARE_PROVIDER_SITE_OTHER): Payer: No Typology Code available for payment source | Admitting: Family Medicine

## 2016-11-04 ENCOUNTER — Other Ambulatory Visit (INDEPENDENT_AMBULATORY_CARE_PROVIDER_SITE_OTHER): Payer: Self-pay

## 2016-11-04 DIAGNOSIS — E119 Type 2 diabetes mellitus without complications: Secondary | ICD-10-CM

## 2016-11-05 ENCOUNTER — Encounter (INDEPENDENT_AMBULATORY_CARE_PROVIDER_SITE_OTHER): Payer: No Typology Code available for payment source | Admitting: Urology

## 2016-11-05 ENCOUNTER — Telehealth (INDEPENDENT_AMBULATORY_CARE_PROVIDER_SITE_OTHER): Payer: Self-pay | Admitting: Urology

## 2016-11-05 NOTE — Telephone Encounter (Signed)
Answering Service Message:    2016/11/04, 8:56pm    Ofc cxl appt 10/23 in the AM  Will call back to reschedule.

## 2016-11-06 ENCOUNTER — Ambulatory Visit: Payer: No Typology Code available for payment source

## 2016-11-06 ENCOUNTER — Ambulatory Visit (INDEPENDENT_AMBULATORY_CARE_PROVIDER_SITE_OTHER): Payer: No Typology Code available for payment source

## 2016-11-06 DIAGNOSIS — E118 Type 2 diabetes mellitus with unspecified complications: Secondary | ICD-10-CM

## 2016-11-06 DIAGNOSIS — M545 Low back pain, unspecified: Secondary | ICD-10-CM

## 2016-11-06 DIAGNOSIS — M5387 Other specified dorsopathies, lumbosacral region: Secondary | ICD-10-CM

## 2016-11-06 DIAGNOSIS — I10 Essential (primary) hypertension: Secondary | ICD-10-CM

## 2016-11-06 DIAGNOSIS — E782 Mixed hyperlipidemia: Secondary | ICD-10-CM

## 2016-11-06 DIAGNOSIS — R1032 Left lower quadrant pain: Secondary | ICD-10-CM

## 2016-11-06 DIAGNOSIS — M546 Pain in thoracic spine: Secondary | ICD-10-CM

## 2016-11-06 DIAGNOSIS — Z23 Encounter for immunization: Secondary | ICD-10-CM

## 2016-11-06 LAB — COMPREHENSIVE METABOLIC PANEL
ALT (GPT): 26 U/L (ref 10–48)
AST (GOT): 15 U/L (ref 9–38)
Albumin: 4.7 g/dL (ref 3.5–5.2)
Alkaline Phosphatase (Total): 52 U/L (ref 37–159)
Anion Gap: 6 (ref 4–12)
Bilirubin (Total): 1.2 mg/dL (ref 0.2–1.3)
Calcium: 9.6 mg/dL (ref 8.9–10.2)
Carbon Dioxide, Total: 29 meq/L (ref 22–32)
Chloride: 100 meq/L (ref 98–108)
Creatinine: 1.1 mg/dL (ref 0.51–1.18)
GFR, Calc, African American: >60 mL/min/{1.73_m2} (ref >59)
GFR, Calc, European American: >60 mL/min/{1.73_m2} (ref >59)
Glucose: 222 mg/dL — ABNORMAL HIGH (ref 62–125)
Potassium: 4.1 meq/L (ref 3.6–5.2)
Protein (Total): 6.4 g/dL (ref 6.0–8.2)
Sodium: 135 meq/L (ref 135–145)
Urea Nitrogen: 24 mg/dL — ABNORMAL HIGH (ref 8–21)

## 2016-11-06 LAB — CBC, DIFF
% Basophils: 1 %
% Eosinophils: 1 %
% Immature Granulocytes: 0 %
% Lymphocytes: 21 %
% Monocytes: 8 %
% Neutrophils: 69 %
% Nucleated RBC: 0 %
Absolute Eosinophil Count: 0.08 10*3/uL (ref 0.00–0.50)
Absolute Lymphocyte Count: 1.28 10*3/uL (ref 1.00–4.80)
Basophils: 0.05 10*3/uL (ref 0.00–0.20)
Hematocrit: 45 % (ref 38–50)
Hemoglobin: 14.6 g/dL (ref 13.0–18.0)
Immature Granulocytes: 0.01 10*3/uL (ref 0.00–0.05)
MCH: 29.1 pg (ref 27.3–33.6)
MCHC: 32.4 g/dL (ref 32.2–36.5)
MCV: 90 fL (ref 81–98)
Monocytes: 0.49 10*3/uL (ref 0.00–0.80)
Neutrophils: 4.12 10*3/uL (ref 1.80–7.00)
Nucleated RBC: 0 10*3/uL
Platelet Count: 278 10*3/uL (ref 150–400)
RBC: 5.01 10*6/uL (ref 4.40–5.60)
RDW-CV: 13.2 % (ref 11.6–14.4)
WBC: 6.03 10*3/uL (ref 4.3–10.0)

## 2016-11-06 MED ORDER — SIMVASTATIN 20 MG OR TABS
20.0000 mg | ORAL_TABLET | Freq: Every day | ORAL | 3 refills | Status: DC
Start: 2016-11-06 — End: 2017-08-28

## 2016-11-06 MED ORDER — METFORMIN HCL 1000 MG OR TABS
ORAL_TABLET | ORAL | 6 refills | Status: DC
Start: 2016-11-06 — End: 2017-06-22

## 2016-11-06 MED ORDER — GABAPENTIN 300 MG OR CAPS
300.0000 mg | ORAL_CAPSULE | Freq: Every evening | ORAL | 0 refills | Status: DC
Start: 2016-11-06 — End: 2017-06-29

## 2016-11-06 NOTE — Progress Notes (Signed)
Flu Screening Questions:    1) Currently have a moderate or severe illness, including fever? NO    2) Ever had a serious allergic reaction to eggs?  NO    3) Ever had a serious reaction to a flu vaccine or another vaccine in the past? NO    4) Ever had Guillain-Barre syndrome associated with a vaccine? NO    5) Less than 6 months old? NO    If YES to any of the questions above - NO Flu Vaccine to be given.  Patient may consult provider as needed.    If NO to all questions above - Patient may receive Flu Shot (IM)    If between 6 months and 8 years of age, was flu vaccine received last year?  N/A    If NO to above question - Needs 2 doses of flu vaccine this year (Future order flu vaccine for at least 4 wks from today).

## 2016-11-06 NOTE — Patient Instructions (Signed)
Seek immediate attention if symptoms are worsening. Call if symptoms are not resolving with therapy.    A letter will be coming within the next two weeks reviewing the lab results.    Best wishes,    Dr. Gimena Buick

## 2016-11-06 NOTE — Progress Notes (Signed)
I, Katy FitchJoel Guy Toney, MD interviewed and examined the patient while overseeing the documentation performed by my Medical Scribe, Emelia LoronKarin Muggli. I have reviewed and revised as necessary the scribes note and agree with the documented findings and plan of care. Please refer to the scribe's note below for further detailed information regarding the patient encounter and exam.  11/06/2016. 6:40 PM.

## 2016-11-06 NOTE — Progress Notes (Signed)
DATE: 11/06/2016     PHYSICIAN NOTE:  Patient Name: William Bryant  Medical Record Number: [id number]  Visit Date/time: 11/06/2016     Date of Birth: [birth year]    CHIEF COMPLAINT:  Chief Complaint   Patient presents with    Musculoskeletal Problem     back pain, left shoulder problem    Medication Management     refills, gabapentin request    Immunization or Injection     flu shot       William Bryant is a 66 year old male who is here today for review of multiple medical problems.    PROBLEMS TO BE ADDRESSED ON TODAYS VISIT:  (R10.32) LLQ abdominal pain  (primary encounter diagnosis)  (E11.8) Type 2 diabetes mellitus with complication, without long-term current use of insulin (HCC)  (M54.5) Lumbar pain  (M54.6) Thoracic pain  (E78.2) Mixed hyperlipidemia  (M53.87) Sciatica of right side associated with disorder of lumbosacral spine  (I10) Essential hypertension    HPI:  History provided by patient.    William Bryant is a 66 year old male who is here today for review of multiple medical problems as reported above.     The patient has a history of chronic left shoulder pain, which he previously was seen Dr. Rob BuntingStorey for. He received several steroid injections, which provided little relief. He is also dealing with continual back pain, which he was seen by Dr. Sharen HonesGutierrez for on 09/04/16. Associated symptoms include sciatic pain down the right leg, which has continued to improve with time. He is requesting a referral to Dr. Rob BuntingStorey for both the shoulder pain and back pain.     He otherwise notes that he has  pain in his left lower quadrant. He also has a history of a lower GI bleed and diverticulitis, which concerns him. He denies any diarrhea, constipation, or blood in his stools.     PROBLEM LIST:  Patient Active Problem List   Diagnosis    Type 2 diabetes mellitus without complication (HCC)    Lower GI bleed    Diverticulosis of large intestine with hemorrhage    Mixed hyperlipidemia       BPH (benign prostatic hyperplasia)    Erectile dysfunction    Family history of early CAD--mother 63    Right cervical radiculopathy    Upper respiratory tract infection    Cough    Hoarseness    Arthrosis of left acromioclavicular joint    Adhesive capsulitis of left shoulder    Essential hypertension    Urinary frequency    Peyronie disease    Thoracic segment dysfunction    Disorder of left rotator cuff    Left hip pain    SI (sacroiliac) joint inflammation (HCC)    Acute midline low back pain       SURGICAL HISTORY:  Past Surgical History:   Procedure Laterality Date    knee replacement x2         MEDICATIONS:  Current Outpatient Prescriptions   Medication Sig Dispense Refill    Aspirin 81 MG Oral Chew Tab Take 81 mg by mouth every day.      Benazepril HCl 5 MG Oral Tab Take 1 tablet (5 mg) by mouth daily. Take 1 Tab by mouth daily at bedtime. To prevent kidney damage. 90 tablet 12    Cholecalciferol (D 2000) 2000 UNITS Oral Tab Take 1 Tab by mouth every day.      Gabapentin 300 MG Oral  Cap Take 1 capsule (300 mg) by mouth at bedtime. 30 capsule 0    MetFORMIN HCl 1000 MG Oral Tab Sig 1 tablet q am and 1.5 tablets q pm with meals 75 tablet 6    Methocarbamol 500 MG Oral Tab Take 1 tablet (500 mg) by mouth every 8 hours as needed for muscle spasms. 30 tablet 0    Simvastatin 20 MG Oral Tab Take 1 tablet (20 mg) by mouth daily. 90 tablet 3    Tamsulosin HCl 0.4 MG Oral Cap Take 1 capsule (0.4 mg) by mouth daily. 30 capsule 11     No current facility-administered medications for this visit.        ALLERGIES:  Review of patient's allergies indicates:  No Known Allergies    FAMILY HISTORY:  Family History     Problem (# of Occurrences) Relation (Name,Age of Onset)    Breast Cancer (1) Sister    Cancer (1) Father: patient believes colon cancer    Deep Vein Thrombosis (1) Father    Heart Disease (1) Mother    Myocardial Infarction (1) Other: grandfather     Prostate Cancer (1) Father           SOCIAL HISTORY:  Social History     Social History    Marital status: N/A     Spouse name: N/A    Number of children: N/A    Years of education: N/A     Occupational History    Not on file.     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 Rare ETOH. Former smoker. No social drug use. Rare exercise.         Colonoscopy performed in June 2015 with Dr. Jaynie Collins, cleared for five years         Ophthalmology visit- referral to Ahmed Prima on 09/20/14        TDAP- 11/2012    Zoster-advised 11/15/15    Influenza-11/15/15        Hepatitis C and HIV screening-09/20/14        Diabetic Eye Exam-done on 10/09/14 by Dr. Dyane Dustman, bilateral nuclear cataracts, no retinopathy           REVIEW OF SYSTEMS:     male    CONSTITUTIONAL: Denies, fatigue, unexpected weight loss, unexpected weight gain  NEUROLOGIC: Denies, headaches and tremors  OPHTHALMIC: Denies, blurry vision, diplopia, eye pain  ENT: Denies, hearing difficulties, nasal congestion and any ear nose or throat problems  RESPIRATORY: Denies, dyspnea on exertion, dyspnea at rest, cough, wheezing  CARDIOVASCULAR: H/o HTN  GI: Reports LLQ pain  GENITOURINARY: H/o urinary frequency  INTEGUMENTARY: Denies, skin rash, easy bruising, hair loss, changes in moles or new moles  RHEUMATOLOGIC/MSK: Denies, joint swelling, joint stiffness, any joint or arthritic problems  ENDOCRINE: H/o type II DM, hypercholesterolemia  HEME/LYMPH:  Denies, Easy bruising, Lumps under arms or around neck and anemia  PSYCHOSOCIAL: Denies any psychological problems  HIV RISK: Denies,  multiple sexual partners, history of a sexually transmitted disease,  transfusion between 1978 and 1985 and use of illicit drugs by injection    PHYSICAL EXAM:  There were no vitals taken for this visit.    Physical Examinationmale: Vital signs as noted above.    General Appearance and Mental Status: Well Developed/Well Nourished  [ ]  Slender  [ ]  Average  [ ]  Heavy [x ] Obese  [x ] Appropriate Affect, Oriented to Person, Place, Time    Skin: Warm, dry to touch    Head: Normocephalic, atraumatic    Eyes: EOMI, PERLA    Ears: TMs clear bilaterally    Nose: Septum intact, no lesions, pink mucosa and turbinates, no discharge    Pharynx: Clear    Neck: Supple, without JVD  or goiter    Chest: Clear     CV: Heart: RRR, no murmur, no rub, no S3, S4 noted    Abdomen: tenderness in the left lower quadrant     Rectal:  Deferred     Extrem: no edema    MSK: tenderness in the lower thoracic and upper lumbar paraspinous musculature    Neuro: He complains of radiating pain along the right lateral thigh       LAB RESULTS:  Pending.     ASSESSMENT AND PLAN:  Pertinent Labs & Imaging studies reviewed. (See chart for details)  Prior EMR records reviewed in EPIC as available and clinically relevant.    1. LLQ abdominal pain  The patient complains of a pulling pain in his left lower quadrant. He also has a history of a lower GI bleed and diverticulitis, which concerns him, but this pain has been ongoing for 2 months. He denies any diarrhea, constipation, or blood in his stools. I ordered a CT of the abdomen and pelvis in addition to a referral to GI for further assessment.   - REFERRAL TO GASTROENTEROLOGY-CLINIC  - CT ABDOMEN AND PELVIS W CONTRAST    2. Type 2 diabetes mellitus with complication, without long-term current use of insulin (HCC)  Patient has a h/o type II DM and his last A1C on 09/08/16 was 7.2. He is currently taking Metformin 1000 mg every morning and 1500 mg every evening. I ordered an A1C today for further evaluation.  - MetFORMIN HCl 1000 MG Oral Tab; Sig 1 tablet q am and 1.5  tablets q pm with meals  Dispense: 75 tablet; Refill: 6  - HEMOGLOBIN A1C, HPLC    3. Lumbar pain  Patient complains continual back pain, which he was seen by Dr. Sharen Hones for on 09/04/16. Associated symptoms include sciatic pain down the right leg, which has continued to improve with time. On examination, there was tenderness in the lower thoracic and upper lumbar paraspinous musculature. I ordered a thoracolumbar spine film for further evaluation.   - XR T-L SPINE 2 VW    4. Thoracic pain  Patient complains continual back pain, which he was seen by Dr. Sharen Hones for on 09/04/16. Associated symptoms include sciatic pain down the right leg, which has continued to improve with time. On examination, there was tenderness in the lower thoracic and upper lumbar paraspinous musculature. I ordered a thoracolumbar spine film for further evaluation.   - XR T-L SPINE 2 VW    5. Mixed hyperlipidemia  Patient has a h/o hypercholesterolemia and is currently taking Simvastatin 20 mg daily. I refilled for same.   - Simvastatin 20 MG Oral Tab; Take 1 tablet (20 mg) by  mouth daily.  Dispense: 90 tablet; Refill: 3    6. 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 He is requesting a prescription for Gabapentin to help him sleep at night after being in the car all day. I prescribed him Gabapentin 300 mg for same.   - Gabapentin 300 MG Oral Cap; Take 1 capsule (300 mg) by mouth at bedtime.  Dispense: 30 capsule; Refill: 0    7. Essential hypertension  Patient has a h/o HTN and is currently taking Benazepril 5 mg daily. I ordered a CBC and CMP for further evaluation.  - CBC, DIFF  - COMPREHENSIVE METABOLIC PANEL      Further treatment dependant on the results of the tests.    Seek immediate attention if symptoms are worsening.    Call if symptoms are not resolving with therapy.      HEALTH MAINTENANCE:   Social History     Social History     Marital status: N/A     Spouse name: N/A    Number of children: N/A    Years of education: N/A     Occupational History    Not on file.     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 Rare ETOH. Former smoker. No social drug use. Rare exercise.         Colonoscopy performed in June 2015 with Dr. Jaynie Collins, cleared for five years         Ophthalmology visit- referral to Ahmed Prima on 09/20/14        TDAP- 11/2012    Zoster-advised 11/15/15    Influenza-11/15/15        Hepatitis C and HIV screening-09/20/14        Diabetic Eye Exam-done on 10/09/14 by Dr. Dyane Dustman, bilateral nuclear cataracts, no retinopathy           Portions of this chart were written by Emelia Loron, Medical Scribe with oversight by Katy Fitch, MD 11/06/2016 10:52 AM    I, Katy Fitch, have reviewed the information recorded by the scribe for accuracy and agree with its contents.      This medical record has been electronically generated with voice recognition software. Every attempt is made to assure accuracy.  However, it may contain errors related to this process.

## 2016-11-07 ENCOUNTER — Telehealth (INDEPENDENT_AMBULATORY_CARE_PROVIDER_SITE_OTHER): Payer: Self-pay

## 2016-11-07 DIAGNOSIS — M5441 Lumbago with sciatica, right side: Secondary | ICD-10-CM

## 2016-11-07 DIAGNOSIS — M25512 Pain in left shoulder: Secondary | ICD-10-CM

## 2016-11-07 DIAGNOSIS — G8929 Other chronic pain: Secondary | ICD-10-CM

## 2016-11-07 LAB — HEMOGLOBIN A1C, HPLC: Hemoglobin A1C: 7.1 % — ABNORMAL HIGH (ref 4.0–6.0)

## 2016-11-07 NOTE — Telephone Encounter (Signed)
(  TEXTING IS AN OPTION FOR [facility] CLINICS ONLY)  Is this a [facility] clinic? No      RETURN CALL: Detailed message on voicemail only      SUBJECT:  Referral Request      REFERRING PROVIDER: Hagedorn, Debe CoderJoel Steven, MD   SYMPTOM(S)/DIAGNOSIS: Left shoulder pain and lower back.   [facility] AND [facility]   NAME OF PROVIDER: Dr. Rob BuntingStorey   TYPE OF SPECIALTY: Orthopedics  CLINIC PHONE: [phone] CLINIC FAX: unknown  HAS THE REFERRAL ALREADY BEEN REQUESTED?: Yes  ADDITIONAL INFORMATION: Patient has appointment with Hagedorn, Debe CoderJoel Steven, MD and this referral was not placed, thank you.

## 2016-11-08 NOTE — Telephone Encounter (Signed)
Dayna--tell him the referral is done.  I just did it.

## 2016-11-10 ENCOUNTER — Encounter (INDEPENDENT_AMBULATORY_CARE_PROVIDER_SITE_OTHER): Payer: Self-pay

## 2016-11-10 NOTE — Telephone Encounter (Signed)
Pt.notified

## 2016-11-17 ENCOUNTER — Encounter (INDEPENDENT_AMBULATORY_CARE_PROVIDER_SITE_OTHER): Payer: Self-pay | Admitting: Family Medicine

## 2016-11-17 ENCOUNTER — Ambulatory Visit (INDEPENDENT_AMBULATORY_CARE_PROVIDER_SITE_OTHER): Payer: No Typology Code available for payment source | Admitting: Family Medicine

## 2016-11-17 VITALS — BP 136/64 | HR 99 | Ht 71.5 in | Wt 220.0 lb

## 2016-11-17 DIAGNOSIS — M5136 Other intervertebral disc degeneration, lumbar region: Secondary | ICD-10-CM | POA: Insufficient documentation

## 2016-11-17 DIAGNOSIS — M75112 Incomplete rotator cuff tear or rupture of left shoulder, not specified as traumatic: Secondary | ICD-10-CM | POA: Insufficient documentation

## 2016-11-17 DIAGNOSIS — Z6830 Body mass index (BMI) 30.0-30.9, adult: Secondary | ICD-10-CM

## 2016-11-17 DIAGNOSIS — M67912 Unspecified disorder of synovium and tendon, left shoulder: Secondary | ICD-10-CM

## 2016-11-17 NOTE — Progress Notes (Signed)
11/17/2016    William Bryant  PCP:  Perrin Maltese, MD    CC:  66 year old male with   Chief Complaint   Patient presents with    Other     Reqesting a referral to see a surgeron regarding his left shoulder.     Back Pain     New problem: LBP with R leg pain. Onset Aug 5th. No recall of injury. Pain is much less now. Did see his PCP's partner's, Dr. Sharen Hones. Had lumbar X-rays done at [facility].         HPI: Onset 2 months ago for no apparent reason of LBP with transient radiation to right anterior and lateral thigh to knee now resolved.  Pain with walking and standing and at HS to roll over.  No pain with valsalva, no dysthesia.   Left shoulder pain not resolved.  Wakes at HS.  Doing HEP.      Review of patient's allergies indicates:  No Known Allergies    Current Outpatient Prescriptions   Medication Sig Dispense Refill    Aspirin 81 MG Oral Chew Tab Take 81 mg by mouth every day.      Benazepril HCl 5 MG Oral Tab Take 1 tablet (5 mg) by mouth daily. Take 1 Tab by mouth daily at bedtime. To prevent kidney damage. 90 tablet 12    Cholecalciferol (D 2000) 2000 UNITS Oral Tab Take 1 Tab by mouth every day.      Gabapentin 300 MG Oral Cap Take 1 capsule (300 mg) by mouth at bedtime. 30 capsule 0    Glimepiride 2 MG Oral Tab       MetFORMIN HCl 1000 MG Oral Tab Sig 1 tablet q am and 1.5 tablets q pm with meals 75 tablet 6    Methocarbamol 500 MG Oral Tab Take 1 tablet (500 mg) by mouth every 8 hours as needed for muscle spasms. 30 tablet 0    Simvastatin 20 MG Oral Tab Take 1 tablet (20 mg) by mouth daily. 90 tablet 3    Tamsulosin HCl 0.4 MG Oral Cap Take 1 capsule (0.4 mg) by mouth daily. 30 capsule 11     No current facility-administered medications for this visit.        Past Medical History:   Diagnosis Date    BPH (benign prostatic hyperplasia)     Diabetes mellitus (HCC)     Diverticulosis     History of BPH     History of erectile dysfunction     Hypertension     Left hip  pain 09/04/2016    Lower GI bleed     Peyronie disease 05/05/2016       Past Surgical History:   Procedure Laterality Date    knee replacement x2         Family History     Problem (# of Occurrences) Relation (Name,Age of Onset)    Breast Cancer (1) Sister    Cancer (1) Father: patient believes colon cancer    Deep Vein Thrombosis (1) Father    Heart Disease (1) Mother    Myocardial Infarction (1) Other: grandfather     Prostate Cancer (1) Father          Social History     Social History    Marital status: N/A     Spouse name: N/A    Number of children: N/A    Years of education: N/A     Occupational History  Not on file.     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 Rare ETOH. Former smoker. No social drug use. Rare exercise.         Colonoscopy performed in June 2015 with Dr. Jaynie CollinsLim, cleared for five years         Ophthalmology visit- referral to Ahmed PrimaAudrey Rostav on 09/20/14        TDAP- 11/2012    Zoster-advised 11/15/15    Influenza-11/15/15        Hepatitis C and HIV screening-09/20/14        Diabetic Eye Exam-done on 10/09/14 by Dr. Dyane DustmanBence, bilateral nuclear cataracts, no retinopathy             ROS:  A 14 system review was performed and is noncontributory to CC.     11/17/2016      PHYSICAL EXAM    BP 136/64    Pulse 99    Ht 5' 11.5" (1.816 m)    Wt (!) 220 lb (99.8 kg)    SpO2 97%    BMI 30.26 kg/m     GENERAL: Alert and oriented to person, place, and time.  No acute distress.    VASCULAR:  Peripheral pulses and capillary refill immediate.  No signs of ischemia.    NEUROLOGICAL:  Sensation intact, no neuritic symptoms provoked with exam.    DERMATOLOGICAL:  No  skin lesions, no erythema, no increased warmth.    MUSCULOSKELETAL:     General: Patient appears to be in Mild pain,Noantalgic gait noted. Lumbosacral spine area reveals no local tenderness or spasm.   ROM: Flexion: Fingertips to Ankles              Extension: 20/30 degrees pain             Lateral Flexion Right: 30/ 45 degrees Left: 30/ 45 degrees pain             Lateral Rotation Right: 20/ 30 degrees Left: 20/ 30 degrees noted.   Straight leg raise is negative at 90 degrees on bilateral.   SENSATION RIGHT LEFT   Back normal  normal    Anterior medial thigh (L1, L2, L3) normal  normal    Anterior lateral thigh/medial calf (L4) normal  normal    Lateral thigh, lateral calf (L5) normal  normal    Posterior thigh, calf (S1) normal  normal              MOTOR STRENGTH RIGHT LEFT   Hip Flexion 5/5 5/5   Knee extension 5/5 5/5   Knee flexion  5/5 5/5   Dorsiflexion 5/5 5/5   EHL 5/5 5/5   Plantar flexion 5/5 5/5     REFLEXES     Knee  2+ 2+   Ankle  2+ 2+   Babinski absent absent   Clonus  0 beats 0 beats     Heel and toe gait normal   Peripheral pulses are  palpable. No skin abnormalities.  Atrophy: Negative  Fasciculations: Negative    Left shoulder FROM  Pain FL and ER  Pain with Jobe's  Strength 5/5  Tender greater tuberosity and posterior joint line    Xrays:  Multilevel DDD worst L 3/4,4/5  Facet arthropathy multilevel      ASSESSMENT:    (M51.36) DDD (degenerative disc disease), lumbar  (primary encounter diagnosis)  Plan: REFERRAL TO PHYSICAL THERAPY            (M67.912) Rotator cuff disorder, left  Plan: REFERRAL TO ORTHOPEDICS                PLAN:    Refer to PT  Wm flexion program  Refer to ortho for shoulder consult      MDS

## 2016-11-19 ENCOUNTER — Ambulatory Visit (INDEPENDENT_AMBULATORY_CARE_PROVIDER_SITE_OTHER): Payer: No Typology Code available for payment source | Admitting: Orthopaedic Surgery

## 2016-11-19 ENCOUNTER — Encounter (INDEPENDENT_AMBULATORY_CARE_PROVIDER_SITE_OTHER): Payer: Self-pay | Admitting: Orthopaedic Surgery

## 2016-11-19 VITALS — BP 129/77 | HR 84 | Ht 71.5 in | Wt 220.0 lb

## 2016-11-19 DIAGNOSIS — Z6830 Body mass index (BMI) 30.0-30.9, adult: Secondary | ICD-10-CM

## 2016-11-19 DIAGNOSIS — M7542 Impingement syndrome of left shoulder: Secondary | ICD-10-CM | POA: Insufficient documentation

## 2016-11-19 DIAGNOSIS — M75112 Incomplete rotator cuff tear or rupture of left shoulder, not specified as traumatic: Secondary | ICD-10-CM

## 2016-11-19 DIAGNOSIS — S43432A Superior glenoid labrum lesion of left shoulder, initial encounter: Secondary | ICD-10-CM

## 2016-11-19 DIAGNOSIS — E119 Type 2 diabetes mellitus without complications: Secondary | ICD-10-CM

## 2016-11-19 DIAGNOSIS — M25612 Stiffness of left shoulder, not elsewhere classified: Secondary | ICD-10-CM | POA: Insufficient documentation
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
## 2016-11-23 ENCOUNTER — Ambulatory Visit (HOSPITAL_BASED_OUTPATIENT_CLINIC_OR_DEPARTMENT_OTHER): Payer: No Typology Code available for payment source

## 2016-12-21 ENCOUNTER — Ambulatory Visit (HOSPITAL_BASED_OUTPATIENT_CLINIC_OR_DEPARTMENT_OTHER): Payer: No Typology Code available for payment source

## 2017-01-03 ENCOUNTER — Ambulatory Visit (HOSPITAL_BASED_OUTPATIENT_CLINIC_OR_DEPARTMENT_OTHER): Payer: No Typology Code available for payment source

## 2017-01-03 ENCOUNTER — Encounter (HOSPITAL_BASED_OUTPATIENT_CLINIC_OR_DEPARTMENT_OTHER): Payer: Self-pay

## 2017-01-14 ENCOUNTER — Inpatient Hospital Stay: Payer: Self-pay

## 2017-01-16 ENCOUNTER — Encounter (INDEPENDENT_AMBULATORY_CARE_PROVIDER_SITE_OTHER): Payer: Self-pay | Admitting: Urology

## 2017-01-16 ENCOUNTER — Ambulatory Visit (INDEPENDENT_AMBULATORY_CARE_PROVIDER_SITE_OTHER): Payer: No Typology Code available for payment source | Admitting: Urology

## 2017-01-16 VITALS — BP 134/72 | HR 84 | Ht 71.5 in | Wt 226.0 lb

## 2017-01-16 DIAGNOSIS — N138 Other obstructive and reflux uropathy: Secondary | ICD-10-CM

## 2017-01-16 DIAGNOSIS — R339 Retention of urine, unspecified: Secondary | ICD-10-CM

## 2017-01-16 DIAGNOSIS — N401 Enlarged prostate with lower urinary tract symptoms: Secondary | ICD-10-CM

## 2017-01-16 DIAGNOSIS — Z6831 Body mass index (BMI) 31.0-31.9, adult: Secondary | ICD-10-CM

## 2017-01-16 HISTORY — DX: Retention of urine, unspecified: R33.9

## 2017-01-16 LAB — PR MEAS POST-VOIDING RESIDUAL URINE&/BLADDER CAP

## 2017-01-16 NOTE — Progress Notes (Signed)
UROLOGY CLINIC NOTE    Reason for Visit:   Chief Complaint   Patient presents with    Prostate Problem     enlarged prostate, seen @ [facility] 01/14/17     History of Present Illness: William Bryant is a 67 year old male who returns to me today.  He is here because he was recently in the emergency room, 2 days ago, with a bad hemorrhoid and rectal bleeding but they found him to have a large prostate and urinary retention.  He had not been taking his tamsulosin for a few days has since restarted it        A 14 system review of systems was completed by the patient and was reviewed he is feeling better after some treatment to this hemorrhoid.     Active Meds:   Outpatient Prescriptions Marked as Taking for the 01/16/17 encounter (Office Visit) with Gwenlyn Fudgeowney, Berkley Wrightsman Robert, MD   Medication Sig Dispense Refill    Aspirin 81 MG Oral Chew Tab Take 81 mg by mouth every day.      Benazepril HCl 5 MG Oral Tab Take 1 tablet (5 mg) by mouth daily. Take 1 Tab by mouth daily at bedtime. To prevent kidney damage. 90 tablet 12    Cholecalciferol (D 2000) 2000 UNITS Oral Tab Take 1 Tab by mouth every day.      Gabapentin 300 MG Oral Cap Take 1 capsule (300 mg) by mouth at bedtime. 30 capsule 0    Glimepiride 2 MG Oral Tab       MetFORMIN HCl 1000 MG Oral Tab Sig 1 tablet q am and 1.5 tablets q pm with meals 75 tablet 6    Methocarbamol 500 MG Oral Tab Take 1 tablet (500 mg) by mouth every 8 hours as needed for muscle spasms. 30 tablet 0    Simvastatin 20 MG Oral Tab Take 1 tablet (20 mg) by mouth daily. 90 tablet 3    Tamsulosin HCl 0.4 MG Oral Cap Take 1 capsule (0.4 mg) by mouth daily. 30 capsule 11     Allergies: Review of patient's allergies indicates:  No Known Allergies    OBJECTIVE:  Physical Exam: BP 134/72    Pulse 84    Ht 5' 11.5" (1.816 m)    Wt (!) 226 lb (102.5 kg)    BMI 31.08 kg/m   Constitutional: WDWN, pleasant and appropriate affect and no acute distress    ASSESSMENT/PLAN:    His postvoid  residual is back down under 100 mL.  I think a lot of this was sphincter problems from hemorrhoid in conjunction with not taking his tamsulosin.  He is much more comfortable and happy.  Follow-up with me in about 6 months.

## 2017-03-23 ENCOUNTER — Other Ambulatory Visit (INDEPENDENT_AMBULATORY_CARE_PROVIDER_SITE_OTHER): Payer: Self-pay | Admitting: Urology

## 2017-03-23 ENCOUNTER — Other Ambulatory Visit (INDEPENDENT_AMBULATORY_CARE_PROVIDER_SITE_OTHER): Payer: Self-pay

## 2017-03-23 DIAGNOSIS — E119 Type 2 diabetes mellitus without complications: Secondary | ICD-10-CM

## 2017-03-23 DIAGNOSIS — R350 Frequency of micturition: Secondary | ICD-10-CM

## 2017-03-23 DIAGNOSIS — I10 Essential (primary) hypertension: Secondary | ICD-10-CM

## 2017-03-23 MED ORDER — TAMSULOSIN HCL 0.4 MG OR CAPS
ORAL_CAPSULE | ORAL | 2 refills | Status: DC
Start: 2017-03-23 — End: 2017-11-26

## 2017-03-23 NOTE — Telephone Encounter (Signed)
03/23/2017      Refill Request Tamsulosin    Last visit: 01/16/2017  Next visit: Visit date not found  Last refill: 03/31/2016  Last Prescribed by: Dr. Samule Ohmowney  Labs: none            If Applicable:        Outpatient Medications Prior to Visit   Medication Sig Dispense Refill    Aspirin 81 MG Oral Chew Tab Take 81 mg by mouth every day.      Benazepril HCl 5 MG Oral Tab Take 1 tablet (5 mg) by mouth daily. Take 1 Tab by mouth daily at bedtime. To prevent kidney damage. 90 tablet 12    Cholecalciferol (D 2000) 2000 UNITS Oral Tab Take 1 Tab by mouth every day.      Gabapentin 300 MG Oral Cap Take 1 capsule (300 mg) by mouth at bedtime. 30 capsule 0    Glimepiride 2 MG Oral Tab       MetFORMIN HCl 1000 MG Oral Tab Sig 1 tablet q am and 1.5 tablets q pm with meals 75 tablet 6    Methocarbamol 500 MG Oral Tab Take 1 tablet (500 mg) by mouth every 8 hours as needed for muscle spasms. 30 tablet 0    Simvastatin 20 MG Oral Tab Take 1 tablet (20 mg) by mouth daily. 90 tablet 3    Tamsulosin HCl 0.4 MG Oral Cap Take 1 capsule (0.4 mg) by mouth daily. 30 capsule 11     No facility-administered medications prior to visit.        William GildingSkylar N Londa Mackowski, MA

## 2017-03-24 MED ORDER — BENAZEPRIL HCL 5 MG OR TABS
ORAL_TABLET | ORAL | 0 refills | Status: DC
Start: 2017-03-24 — End: 2017-05-19

## 2017-03-24 NOTE — Telephone Encounter (Signed)
Miriam--is the patient still taking glimepiride?  If so what is his current dose.    Tell him Dr Morton StallGuttierez took it off his med list on 09/09/16.

## 2017-03-24 NOTE — Telephone Encounter (Signed)
Rx pending.  Routing to provider to review and advise

## 2017-03-24 NOTE — Telephone Encounter (Signed)
This request falls outside refill center's protocols:     Glimepiride - currently listed as historical med, will defer to PCP for review     If this medication is denied please have your staff inform the patient and schedule an appointment if necessary

## 2017-03-25 MED ORDER — GLIMEPIRIDE 2 MG OR TABS
2.0000 mg | ORAL_TABLET | Freq: Every day | ORAL | 1 refills | Status: DC
Start: 2017-03-25 — End: 2017-06-30

## 2017-03-25 NOTE — Telephone Encounter (Signed)
Still taking glimiperide    2 mg , 1 per day

## 2017-03-25 NOTE — Addendum Note (Signed)
Addended by: Perrin MalteseHAGEDORN, Xoe Hoe STEVEN on: 03/25/2017 02:11 PM     Modules accepted: Orders

## 2017-05-19 ENCOUNTER — Other Ambulatory Visit (INDEPENDENT_AMBULATORY_CARE_PROVIDER_SITE_OTHER): Payer: Self-pay

## 2017-05-19 DIAGNOSIS — I10 Essential (primary) hypertension: Secondary | ICD-10-CM

## 2017-05-20 MED ORDER — BENAZEPRIL HCL 5 MG OR TABS
ORAL_TABLET | ORAL | 1 refills | Status: DC
Start: 2017-05-20 — End: 2017-06-29

## 2017-05-20 NOTE — Telephone Encounter (Signed)
This medication is outside of the Refill Center's protocols. Please sign and close the encounter if you approve: Benazepril  tablet    Outside home care on 12/25/16  Unclear if transferred care. Defer to provider.    If this medication is denied please have your staff inform the patient and schedule an appointment if necessary.

## 2017-06-22 ENCOUNTER — Telehealth (INDEPENDENT_AMBULATORY_CARE_PROVIDER_SITE_OTHER): Payer: Self-pay

## 2017-06-22 DIAGNOSIS — E118 Type 2 diabetes mellitus with unspecified complications: Secondary | ICD-10-CM

## 2017-06-22 NOTE — Telephone Encounter (Signed)
Last visit: 11/06/2016  Next visit: 06/29/2017 with Dr. Reece AgarG.    Last refill: 11/06/2016 #[id number] refills  Dx: Type 2 diabetes mellitus with complication, without long-term current use of insulin (HCC)     Routing to future PCP to review and sign pending rx. Thank you.

## 2017-06-22 NOTE — Telephone Encounter (Signed)
Has appointment scheduled on 06/29/17    MetFORMIN HCl 1000 MG Oral Tab        Sig: Sig 1 tablet q am and 1.5 tablets q pm with meals          [facility]  [phone]

## 2017-06-23 MED ORDER — METFORMIN HCL 1000 MG OR TABS
ORAL_TABLET | ORAL | 0 refills | Status: DC
Start: 2017-06-23 — End: 2017-07-12

## 2017-06-29 ENCOUNTER — Ambulatory Visit: Payer: Medicare HMO | Attending: Family Medicine

## 2017-06-29 ENCOUNTER — Ambulatory Visit (INDEPENDENT_AMBULATORY_CARE_PROVIDER_SITE_OTHER): Payer: Medicare HMO | Admitting: Family Medicine

## 2017-06-29 VITALS — BP 134/76 | HR 70 | Wt 230.0 lb

## 2017-06-29 DIAGNOSIS — Z6831 Body mass index (BMI) 31.0-31.9, adult: Secondary | ICD-10-CM

## 2017-06-29 DIAGNOSIS — E119 Type 2 diabetes mellitus without complications: Secondary | ICD-10-CM

## 2017-06-29 DIAGNOSIS — R339 Retention of urine, unspecified: Secondary | ICD-10-CM | POA: Insufficient documentation

## 2017-06-29 DIAGNOSIS — I10 Essential (primary) hypertension: Secondary | ICD-10-CM | POA: Insufficient documentation

## 2017-06-29 DIAGNOSIS — M25551 Pain in right hip: Secondary | ICD-10-CM

## 2017-06-29 DIAGNOSIS — Z136 Encounter for screening for cardiovascular disorders: Secondary | ICD-10-CM | POA: Insufficient documentation

## 2017-06-29 DIAGNOSIS — K5731 Diverticulosis of large intestine without perforation or abscess with bleeding: Secondary | ICD-10-CM

## 2017-06-29 DIAGNOSIS — L853 Xerosis cutis: Secondary | ICD-10-CM

## 2017-06-29 DIAGNOSIS — M25552 Pain in left hip: Secondary | ICD-10-CM

## 2017-06-29 DIAGNOSIS — G8929 Other chronic pain: Secondary | ICD-10-CM

## 2017-06-29 LAB — COMPREHENSIVE METABOLIC PANEL
ALT (GPT): 22 U/L (ref 10–48)
AST (GOT): 16 U/L (ref 9–38)
Albumin: 4.5 g/dL (ref 3.5–5.2)
Alkaline Phosphatase (Total): 53 U/L (ref 36–161)
Anion Gap: 12 (ref 4–12)
Bilirubin (Total): 1.5 mg/dL — ABNORMAL HIGH (ref 0.2–1.3)
Calcium: 9.4 mg/dL (ref 8.9–10.2)
Carbon Dioxide, Total: 27 meq/L (ref 22–32)
Chloride: 98 meq/L (ref 98–108)
Creatinine: 0.8 mg/dL (ref 0.51–1.18)
GFR, Calc, African American: >60 mL/min/{1.73_m2} (ref >59)
GFR, Calc, European American: >60 mL/min/{1.73_m2} (ref >59)
Glucose: 186 mg/dL — ABNORMAL HIGH (ref 62–125)
Potassium: 4.4 meq/L (ref 3.6–5.2)
Protein (Total): 6.6 g/dL (ref 6.0–8.2)
Sodium: 137 meq/L (ref 135–145)
Urea Nitrogen: 17 mg/dL (ref 8–21)

## 2017-06-29 LAB — CBC, DIFF
% Basophils: 1 %
% Eosinophils: 1 %
% Immature Granulocytes: 0 %
% Lymphocytes: 15 %
% Monocytes: 9 %
% Neutrophils: 74 %
% Nucleated RBC: 0 %
Absolute Eosinophil Count: 0.04 10*3/uL (ref 0.00–0.50)
Absolute Lymphocyte Count: 1.06 10*3/uL (ref 1.00–4.80)
Basophils: 0.04 10*3/uL (ref 0.00–0.20)
Hematocrit: 47 % (ref 38–50)
Hemoglobin: 15.3 g/dL (ref 13.0–18.0)
Immature Granulocytes: 0.03 10*3/uL (ref 0.00–0.05)
MCH: 29.3 pg (ref 27.3–33.6)
MCHC: 32.7 g/dL (ref 32.2–36.5)
MCV: 90 fL (ref 81–98)
Monocytes: 0.61 10*3/uL (ref 0.00–0.80)
Neutrophils: 5.16 10*3/uL (ref 1.80–7.00)
Nucleated RBC: 0 10*3/uL
Platelet Count: 282 10*3/uL (ref 150–400)
RBC: 5.22 10*6/uL (ref 4.40–5.60)
RDW-CV: 13.1 % (ref 11.6–14.4)
WBC: 6.94 10*3/uL (ref 4.3–10.0)

## 2017-06-29 LAB — LIPID PANEL
Cholesterol (LDL): 63 mg/dL (ref <130)
Cholesterol/HDL Ratio: 3.1
HDL Cholesterol: 45 mg/dL (ref >39)
Non-HDL Cholesterol: 95 mg/dL (ref 0–159)
Total Cholesterol: 140 mg/dL (ref <200)
Triglyceride: 161 mg/dL — ABNORMAL HIGH (ref <150)

## 2017-06-29 LAB — PSA, SCREENING: PSA, Screening: 2.98 ng/mL (ref 0.00–4.00)

## 2017-06-29 MED ORDER — BENAZEPRIL HCL 5 MG OR TABS
5.0000 mg | ORAL_TABLET | Freq: Every day | ORAL | 1 refills | Status: DC
Start: 2017-06-29 — End: 2017-11-26

## 2017-06-29 MED ORDER — ZINC OXIDE 40 % EX OINT
TOPICAL_OINTMENT | Freq: Every evening | CUTANEOUS | 0 refills | Status: DC
Start: 2017-06-29 — End: 2018-02-09

## 2017-06-29 NOTE — Patient Instructions (Signed)
Patient Education     Prostate Anatomy  The prostate gland is part of the male reproductive system. The prostate is located below the bladder. It surrounds the urethra, the tube that carries urine and semen out of the body. The function of the prostate is to produce fluid. This fluid mixes with fluid from the seminal vesicles and sperm from the testicles to form semen. During ejaculation, semen travels through the urethra and out of the penis. Prostate health is closely linked to hormones, chemicals that carry messages throughout the body. Normal levels of hormones, such as testosterone, keep the prostate working correctly.    Date Last Reviewed: 02/14/2015   2000-2017 [employer], [place]. [address], [place], [place] [address]. All rights reserved. This information is not intended as a substitute for professional medical care. Always follow your healthcare professional's instructions.           Patient Education     Diverticulosis    Diverticulosismeans that small pouches have formed in the wall ofyourlarge intestine (colon). Most often, this problem causes no symptoms and is common as people age. But the pouches in the colon are at risk of becoming infected. When this happens, the condition is called diverticulitis. Although most people with diverticulosis never develop diverticulitis, it is still not uncommon. Rectal bleeding can also occur and in less common situations, a type of colon inflammation called colitis.  While most people do nothave symptoms, some people with diverticulosis mayhave:   Abdominal cramps and pain   Bloating   Constipation   Change in bowel habits  Causes  The exact cause of diverticulosis (and diverticulitis) has not been proved, buta few things are associated with the condition:   Low-fiber diet   Constipation   Lack of exercise  Your healthcare provider will talk with you about how to manage your condition. Diet changes may be all that are needed to help control  diverticulosis and prevent progression to diverticulitis. If you develop diverticulitis, you will likely needother treatments.  Home care  You may be told to take fiber supplements daily. Fiber adds bulk to the stool so that it passes through the colon more easily. Stool softeners may be recommended. You may also be given medications for pain relief. Be sure to take all medications as directed.  In the past, people were told to avoid corn, nuts, and seeds. This is no longer necessary.  Follow these guidelines when caring for yourself at home:   Eat unprocessed foods that are high in fiber. Whole grains, fruits, and vegetables are good choices.   Drink 6 to 8 glasses of water every day unless your healthcare provider has you limit how muchfluid you should have.   Watch for changes in your bowel movements. Tell your provider if you notice any changes.   Begin an exercise program. Ask your provider how to get started. Generally, walking is the best.   Get plenty of rest and sleep.  Follow-up care  Follow up with your healthcare provider, oras advised. Regular visits may be needed to check on your health. Sometimes special procedures such as colonoscopy, are needed after an episode of diverticulitis or blooding. Be sure to keep all your appointments.  If a stool sample was taken, or cultures were done, you should be told if they are positive, or if your treatment needs to be changed. You can call as directed for the results.  If X-rays were done,a radiologist will look at them. You will  be told if there is a change in your treatment.  If antibiotics were prescribed, be sure to finish them all.  When to seek medical advice  Call your healthcare provider right awayif any of these occur:   Fever of 100.80F (38C) or higher, or as directed by your healthcare provider   Severe cramps in the lower left side of the abdomen or pain that is gettingworse   Tenderness in the lower left side of the abdomen or  worsening pain throughout the abdomen   Diarrhea or constipation that doesn't get better within 24 hours   Nausea and vomiting   Bleeding from the rectum  Call 911  Call emergency services if any of the following occur:   Trouble breathing   Confusion   Very drowsy or trouble awakening   Fainting or loss of consciousness   Rapid heart rate   Chest pain  Date Last Reviewed: 01/11/2014   2000-2017 [employer]. [address], [place], [place] [id number]. All rights reserved. This information is not intended as a substitute for professional medical care. Always follow your healthcare professional's instructions.           Patient Education     Step-by-Step:  Inspecting Your Feet (Diabetes)    Date Last Reviewed: 10/14/2014   2000-2017 [employer] [address], [place], [place] [id number]. All rights reserved. This information is not intended as a substitute for professional medical care. Always follow your healthcare professional's instructions.
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
## 2017-06-30 ENCOUNTER — Encounter (INDEPENDENT_AMBULATORY_CARE_PROVIDER_SITE_OTHER): Payer: Self-pay | Admitting: Family Medicine

## 2017-06-30 DIAGNOSIS — E119 Type 2 diabetes mellitus without complications: Secondary | ICD-10-CM

## 2017-06-30 LAB — HEMOGLOBIN A1C, HPLC: Hemoglobin A1C: 7.7 % — ABNORMAL HIGH (ref 4.0–6.0)

## 2017-06-30 MED ORDER — GLIMEPIRIDE 4 MG OR TABS
4.0000 mg | ORAL_TABLET | Freq: Every day | ORAL | 1 refills | Status: DC
Start: 2017-06-30 — End: 2017-11-26

## 2017-07-01 ENCOUNTER — Telehealth (INDEPENDENT_AMBULATORY_CARE_PROVIDER_SITE_OTHER): Payer: Self-pay | Admitting: Family Medicine

## 2017-07-01 DIAGNOSIS — Z136 Encounter for screening for cardiovascular disorders: Secondary | ICD-10-CM

## 2017-07-01 NOTE — Telephone Encounter (Addendum)
Caller: Pam   Relationship to patient: [facility]         Preferred Phone#: [phone]     OK to leave vm msg: YES                         Subject:                        Message: Existing US order for the Abdomin needs to be changed to Abdominal Limited.      Per Pam, US EXAM  ABDO BACK WALL is not in their Data Base.

## 2017-07-02 NOTE — Telephone Encounter (Signed)
Order changed.    Routing to provider to review and advise.

## 2017-07-06 ENCOUNTER — Encounter (INDEPENDENT_AMBULATORY_CARE_PROVIDER_SITE_OTHER): Payer: Medicare HMO | Admitting: Family Medicine

## 2017-07-12 ENCOUNTER — Other Ambulatory Visit (INDEPENDENT_AMBULATORY_CARE_PROVIDER_SITE_OTHER): Payer: Self-pay | Admitting: Family Medicine

## 2017-07-12 DIAGNOSIS — E118 Type 2 diabetes mellitus with unspecified complications: Secondary | ICD-10-CM

## 2017-07-14 MED ORDER — METFORMIN HCL 1000 MG OR TABS
ORAL_TABLET | ORAL | 0 refills | Status: DC
Start: 2017-07-14 — End: 2017-07-24

## 2017-07-20 ENCOUNTER — Encounter (INDEPENDENT_AMBULATORY_CARE_PROVIDER_SITE_OTHER): Payer: Self-pay | Admitting: Family Medicine

## 2017-07-20 ENCOUNTER — Ambulatory Visit (HOSPITAL_BASED_OUTPATIENT_CLINIC_OR_DEPARTMENT_OTHER): Payer: Medicare HMO

## 2017-07-20 ENCOUNTER — Ambulatory Visit: Payer: Medicare HMO | Attending: Family Medicine

## 2017-07-20 DIAGNOSIS — M25551 Pain in right hip: Secondary | ICD-10-CM | POA: Insufficient documentation

## 2017-07-20 DIAGNOSIS — K5731 Diverticulosis of large intestine without perforation or abscess with bleeding: Secondary | ICD-10-CM | POA: Insufficient documentation

## 2017-07-20 DIAGNOSIS — G8929 Other chronic pain: Secondary | ICD-10-CM

## 2017-07-20 DIAGNOSIS — M25552 Pain in left hip: Secondary | ICD-10-CM

## 2017-07-21 LAB — OCCULT BLOOD BY IA, STL: Occult Bld 1 Result: NEGATIVE

## 2017-07-24 ENCOUNTER — Telehealth (INDEPENDENT_AMBULATORY_CARE_PROVIDER_SITE_OTHER): Payer: Self-pay | Admitting: Family Medicine

## 2017-07-24 DIAGNOSIS — E118 Type 2 diabetes mellitus with unspecified complications: Secondary | ICD-10-CM

## 2017-07-24 MED ORDER — METFORMIN HCL 1000 MG OR TABS
ORAL_TABLET | ORAL | 3 refills | Status: DC
Start: 2017-07-24 — End: 2017-10-13

## 2017-07-24 NOTE — Telephone Encounter (Signed)
Routing to provider to review and advise.  Pt has eCare.

## 2017-07-24 NOTE — Telephone Encounter (Signed)
Patient would like to speak to Dr Reece AgarG regarding his metformin refills, patient states he should have refills for a year but does not . Please call.  Thank you

## 2017-07-27 ENCOUNTER — Encounter (INDEPENDENT_AMBULATORY_CARE_PROVIDER_SITE_OTHER): Payer: Self-pay | Admitting: Family Medicine

## 2017-07-27 ENCOUNTER — Ambulatory Visit: Payer: Medicare HMO | Attending: Family Medicine

## 2017-07-27 DIAGNOSIS — E782 Mixed hyperlipidemia: Secondary | ICD-10-CM

## 2017-07-27 DIAGNOSIS — I700 Atherosclerosis of aorta: Secondary | ICD-10-CM

## 2017-07-27 DIAGNOSIS — Z136 Encounter for screening for cardiovascular disorders: Secondary | ICD-10-CM | POA: Insufficient documentation

## 2017-07-27 DIAGNOSIS — E119 Type 2 diabetes mellitus without complications: Secondary | ICD-10-CM

## 2017-08-21 ENCOUNTER — Telehealth: Payer: Self-pay | Admitting: Family Medicine

## 2017-08-21 NOTE — Telephone Encounter (Signed)
Outreach to patient to schedule Annual Wellness Visit with PCP.  Insurer: [employer]  Spoke with pt. Explained reason for call and purpose of the Annual Wellness Visit.   Patient scheduled for AWV with pcp on August 28 2017. at [facility]9AM. Verified preferred contact phone number. Questions answered to patient's satisfaction. Encouraged patient to contact  PCP office with any  questions about visit. [facility]ided [facility], [phone] if any changes need to be made to appointment.   Closing Encounter.

## 2017-08-21 NOTE — Telephone Encounter (Signed)
No labs before visit  Just had in June  Is this  Medicare?  Explain medicare is administrative visit

## 2017-08-21 NOTE — Telephone Encounter (Signed)
Routing to provider.    Last labs done 06/29/2017

## 2017-08-21 NOTE — Telephone Encounter (Signed)
Message sent via eCare.
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
## 2017-08-28 ENCOUNTER — Telehealth (INDEPENDENT_AMBULATORY_CARE_PROVIDER_SITE_OTHER): Payer: Self-pay | Admitting: Family Medicine

## 2017-08-28 ENCOUNTER — Ambulatory Visit (INDEPENDENT_AMBULATORY_CARE_PROVIDER_SITE_OTHER): Payer: Medicare HMO | Admitting: Family Medicine

## 2017-08-28 VITALS — BP 122/76 | HR 70 | Ht 71.0 in | Wt 225.0 lb

## 2017-08-28 DIAGNOSIS — M5136 Other intervertebral disc degeneration, lumbar region: Secondary | ICD-10-CM

## 2017-08-28 DIAGNOSIS — M7542 Impingement syndrome of left shoulder: Secondary | ICD-10-CM

## 2017-08-28 DIAGNOSIS — R490 Dysphonia: Secondary | ICD-10-CM

## 2017-08-28 DIAGNOSIS — R350 Frequency of micturition: Secondary | ICD-10-CM

## 2017-08-28 DIAGNOSIS — E119 Type 2 diabetes mellitus without complications: Secondary | ICD-10-CM

## 2017-08-28 DIAGNOSIS — M25552 Pain in left hip: Secondary | ICD-10-CM

## 2017-08-28 DIAGNOSIS — R1032 Left lower quadrant pain: Secondary | ICD-10-CM

## 2017-08-28 DIAGNOSIS — M25612 Stiffness of left shoulder, not elsewhere classified: Secondary | ICD-10-CM

## 2017-08-28 DIAGNOSIS — Z6831 Body mass index (BMI) 31.0-31.9, adult: Secondary | ICD-10-CM

## 2017-08-28 DIAGNOSIS — M67912 Unspecified disorder of synovium and tendon, left shoulder: Secondary | ICD-10-CM

## 2017-08-28 DIAGNOSIS — N486 Induration penis plastica: Secondary | ICD-10-CM

## 2017-08-28 DIAGNOSIS — N138 Other obstructive and reflux uropathy: Secondary | ICD-10-CM

## 2017-08-28 DIAGNOSIS — M7502 Adhesive capsulitis of left shoulder: Secondary | ICD-10-CM

## 2017-08-28 DIAGNOSIS — M75112 Incomplete rotator cuff tear or rupture of left shoulder, not specified as traumatic: Secondary | ICD-10-CM

## 2017-08-28 DIAGNOSIS — M461 Sacroiliitis, not elsewhere classified: Secondary | ICD-10-CM

## 2017-08-28 DIAGNOSIS — K5731 Diverticulosis of large intestine without perforation or abscess with bleeding: Secondary | ICD-10-CM

## 2017-08-28 DIAGNOSIS — E782 Mixed hyperlipidemia: Secondary | ICD-10-CM

## 2017-08-28 DIAGNOSIS — Z0000 Encounter for general adult medical examination without abnormal findings: Secondary | ICD-10-CM | POA: Insufficient documentation

## 2017-08-28 DIAGNOSIS — R339 Retention of urine, unspecified: Secondary | ICD-10-CM

## 2017-08-28 DIAGNOSIS — S43432S Superior glenoid labrum lesion of left shoulder, sequela: Secondary | ICD-10-CM

## 2017-08-28 DIAGNOSIS — M19012 Primary osteoarthritis, left shoulder: Secondary | ICD-10-CM

## 2017-08-28 DIAGNOSIS — N401 Enlarged prostate with lower urinary tract symptoms: Secondary | ICD-10-CM

## 2017-08-28 DIAGNOSIS — I10 Essential (primary) hypertension: Secondary | ICD-10-CM

## 2017-08-28 DIAGNOSIS — M6281 Muscle weakness (generalized): Secondary | ICD-10-CM

## 2017-08-28 DIAGNOSIS — M549 Dorsalgia, unspecified: Secondary | ICD-10-CM

## 2017-08-28 MED ORDER — GEMFIBROZIL 600 MG OR TABS
600.0000 mg | ORAL_TABLET | Freq: Two times a day (BID) | ORAL | 3 refills | Status: DC
Start: 2017-08-28 — End: 2017-09-16

## 2017-08-28 NOTE — Patient Instructions (Addendum)
The Annual Wellness Visit (AWV) is to encourage you to take an active role in accurately assessing and managing your health, and consequently improve your well-being and quality of life        I recommend you stop taking simvastatin for 3 months to see if the pain in muscles and joint improves  Instead, take gemfibrozil        Normal caliber aorta. No evidence of aneurysm. There is some mild   atherosclerotic aortic plaque  Patient Education     Abdominal Pain    Abdominal pain is pain in the stomach or belly area. Everyone has this pain from time to time. In many cases it goes away on its own. But abdominal pain can sometimes be due to a serious problem, such as appendicitis. So its important to know when to seek help.  Causes of abdominal pain  There are many possible causes of abdominal pain. Common causes in adults include:   Constipation, diarrhea, or gas   Stomach acid flowing back up into the esophagus (acid reflux or heartburn)   Severe acid reflux, called GERD (gastroesophageal reflux disease)   A sore in the lining of the stomach or small intestine (peptic ulcer)   Inflammation of the gallbladder, liver,or pancreas   Gallstones or kidney stones   Appendicitis   Intestinal blockage   An internal organ pushing through a muscle or other tissue (hernia)   Urinary tract infections   In women, menstrual cramps, fibroids, or endometriosis   Inflammation or infection of the intestines  Diagnosing the cause of abdominal pain  Your healthcare provider will do a physical exam help find the cause of your pain. If needed, tests will be ordered. Belly pain has many possible causes. So it can be hard to find the reason for your pain. Giving details about your pain can help. Tell your provider where and when you feel the pain, and what makes it better or worse. Also let your provider know if you have other symptoms such as:   Fever   Tiredness   Upset stomach (nausea)   Vomiting   Changes in bathroom  habits  Treating abdominal pain  Some causes of pain need emergency medical treatment right away. These include appendicitis or a bowel blockage. Other problems can be treated with rest, fluids, or medicines. Your healthcare provider can give you specific instructions for treatment or self-care based on what is causing your pain.  If you have vomiting or diarrhea,sip water or other clear fluids. When you are ready to eat solid foods again, start with small amounts of easy-to-digest, low-fat foods. These include apple sauce, toast, or crackers.   When to seek medical care  Call 911or go to the hospital right away if you:   Cant pass stool and are vomiting   Are vomiting blood or have bloody diarrhea or black, tarry diarrhea   Have chest, neck, or shoulder pain   Feel like you might pass out   Have pain in your shoulder blades with nausea   Have sudden, severe belly pain   Have new, severepain unlike any you have felt before   Have a belly that is rigid, hard, and tender to touch  Call your healthcare provider if you have:   Pain for more than5days   Bloating for more than 2days   Diarrhea for more than5days   A fever of 100.72F (38C) or higher, or as directed by your healthcare provider   Pain that gets  worse   Weight loss for no reason   Continued lack of appetite   Blood in your stool  How to prevent abdominal pain  Here are some tips to help prevent abdominal pain:   Eat smaller amounts of food at one time.   Avoid greasy, fried, or other high-fat foods.   Avoid foods that give you gas.   Exercise regularly.   Drink plenty of fluids.  To help prevent GERD symptoms:   Quit smoking.   Reduce alcohol and certain foods that increase stomach acid.   Avoid aspirin and over-the-counter pain and fever medicines (NSAIDS or nonsteroidal anti-inflammatory drugs), if possible   Lose extra weight.   Finish eating at least 2 hours before you go to bed or lie down.   Raise the head of your  bed.  Date Last Reviewed: 07/14/2014   2000-2018 [employer], [place]. [address] [place], [place] [address]. All rights reserved. This information is not intended as a substitute for professional medical care. Always follow your healthcare professional's instructions.           Patient Education     Controlling Your Cholesterol  Cholesterol is a waxy substance. It travels in your blood through the blood vessels. When you have high cholesterol, it can build up along the walls of the blood vessels. This makes the vessels narrower and decreases blood flow. You are then at greater risk of having a heart attack or a stroke.  Good and bad cholesterol  Lipids are fats, and blood is mostly water. Fat and water don't mix. So our bodies need lipoproteins (lipids inside a protein shell) to carry the lipids. The protein shell carries its lipids through the bloodstream. There are two main kinds of lipoproteins:   LDL (low-density lipoprotein) is known as "bad cholesterol." It mainly carries cholesterol. It delivers this cholesterol to body cells. Excess LDL cholesterol will build up in artery walls. This increases your riskfor heart disease and stroke.   HDL (high-density lipoprotein) is known as "good cholesterol." This protein shell collects excess cholesterol that LDLs have left behind on blood vessel walls. That's why high levels of HDL cholesterol can decrease your risk of heart disease and stroke.  Controlling cholesterol levels  Total cholesterol includes LDL and HDL cholesterol, as well as other fats in the bloodstream. If your total cholesterol is high, follow the steps below to help lower your total cholesterol level:  Eat less unhealthy fat   Cut back on saturated fats and trans fats (also called hydrogenated) by selecting lean cuts of meat, low-fat dairy, and using oils instead of solid fats. Limit baked goods, processed meats, and fried foods. A diet thats high in these fats increases your bad  cholesterol. It's not enough to just cut back on foods containing cholesterol.   Eat about 2 servings of fish per week. Most fish contain omega-3 fatty acids. These help lower blood cholesterol.   Eat more whole grains and soluble fiber (such as oat bran). These lower overall cholesterol.  Be active   Choose an activity you enjoy. Walking, swimming, and riding a bike are some good ways to be active.   Start at a level where you feel comfortable. Increase your time and pace a little each week.   Work up to 30 to 40 minutes of moderate to high intensity physical activity at least 3 to 4 days per week.   Remember, some activity is better than none.   If you  haven't been exercising regularly, start slowly. Check with your healthcare provider to make sure the exercise plan is right for you.  Quit smoking  Quitting smoking can improve your lipid levels. It also lowers your risk for heart disease and stroke.  Manage your weight  If you are overweight or obese, your healthcare provider will work with you to lose weight and lower your BMI (body mass index) to a normal or near-normal level. Making diet changes and increasing physical activity can help.  Take medicine as directed  Many people need medicine to get their LDL levels to a safe level. Medicine to lower cholesterol levels is effective and safe. Taking medicine is not a substitute for exercise or watching your diet! Your healthcare provider can tell you whether you might benefit from a cholesterol-lowering medicine.  Date Last Reviewed: 06/14/2015   2000-2018 [employer] [address], [place], [place] [id number]. All rights reserved. This information is not intended as a substitute for professional medical care. Always follow your healthcare professional's instructions.           Patient Education     Diet: Diabetes  Food is an important tool that you can use to control diabetes and stay healthy. Eating well-balanced meals in the correct amounts  will help you control your blood glucose levels and prevent low blood sugar reactions. It will also help you reduce the health risks of diabetes. There is no one specific diet that is right for everyone with diabetes. But there are general guidelines to follow. A registered dietitian (RD) will create a tailored diet approach thats just right for you. He or she will also help you plan healthy meals and snacks. If you have any questions, call your dietitian for advice.    Guidelines for success  Talk with your healthcare provider before starting a diabetes diet or weight loss program. If you haven't talked with a dietitian yet, ask your provider for a referral. The following guidelines can help you succeed:   Select foods from the 6 food groups below. Your dietitian will help you find food choices within each group. He or she will also show you serving sizes and how many servings you can have at each meal.  ? Grains, beans, and starchy vegetables  ? Vegetables  ? Fruit  ? Milk or yogurt  ? Meat, poultry, fish, or tofu  ? Healthy fats   Check your blood sugar levels as directed by your provider. Take any medicine as prescribed by your provider.   Learn to read food labels and pick the right portion sizes.   Eat only the amount of food in your meal plan. Eat about the same amount of food at regular times each day. Dont skip meals. Eat meals 4 to 5 hours apart, with snacks in between.   Limit alcohol. It raises blood sugar levels. Drink water or calorie-free diet drinks that use safe sweeteners.   Eat less fat to help lower your risk of heart disease. Use nonfat or low-fat dairy products and lean meats. Avoid fried foods. Use cooking oils that are unsaturated, such as olive, canola, or peanut oil.   Talk with your dietitian about safe sugar substitutes.   Avoid added salt. It can contribute to high blood pressure, which can cause heart disease. People with diabetes already have a risk of high blood pressure  and heart disease.   Stay at a healthy weight. If you need to lose weight, cut down on  your portion sizes. But dont skip meals. Exercise is an important part of any weight management program. Talk with your provider about an exercise program thats right for you.   For more information about the best diet plan for you, talk with a registered dietitian (RD). To find an RD in your area, contact:  ? Academy of Nutrition and Dietetics www.eatright.org  ? The American Diabetes Association [phone] www.diabetes.org  Date Last Reviewed: 08/14/2014   2000-2018 [employer] [address], [place], [place] [id number]. All rights reserved. This information is not intended as a substitute for professional medical care. Always follow your healthcare professional's instructions.

## 2017-08-28 NOTE — Telephone Encounter (Signed)
He is STOPPING SIMVASTATIN  Please inform pharmacy

## 2017-08-28 NOTE — Progress Notes (Signed)
ANNUAL WELLNESS VISIT     William Bryant presents for a subsequent Annual Wellness Visit.    Rooming activities:  Height and weight measured: YES  PHQ-2 responses entered into screening section: YES  PHQ-9 given if PHQ-2 score is >0: YES         HEALTH RISK ASSESSMENT:     Current providers and suppliers  X See Care Team Section  X See also EHR Encounters for [facility] Providers involved in care  X Other providers and suppliers outside [facility]: n/a     SELF ASSESSMENT OF HEALTH:  How do you rate your overall health in the past 4 weeks?   good  Can you manage your health problems?  YES  Due to any health problems, do you need the help of another person with your personal care needs such as eating, bathing,  dressing or getting around the house?  NO      DEPRESSION SCREEN and PSYCHOSOCIAL HEALTH:    PHQ-2 Total Score:  PHQ2 Total Score: 0      Have your feelings caused you distress or interfered with your ability to get along socially with family or friends? not at all   In the past 2 weeks, have you felt stress over health, finances, relationships or work?  not at all  Do you often get the emotional support you need? not at all  In the past 2 weeks, how much body pain do you have? Nearly every day In the past 2 weeks, how much fatigue do you have? several days      HEALTH AND HABITS:  How much alcohol do you drink weekly?  I did not drink in the past year  Is your diet well-balanced/healthy, including protein, fiber, vegetables, and fruits? YES   Do you exercise regularly? NO        Type of exercise no regular exercise         Frequency of exercise n/a  Do you always use your seat belt in the car? YES  How would you describe the condition of your mouth and teeth ? including false teeth or dentures?  fair  Are you sexually active? YES  Do you find yourself having trouble hearing people speak?  No  Do you wear a hearing aid/device? No  Do you have a fire extinguisher in your home?  YES  Do you have a  smoke detector?  YES        ACTIVITIES OF DAILY LIVING:  In your present state of health how much difficulty do you have with the following activities:       Preparing food and eating: 0 No impairment (the person has no problem)       Bathing yourself: 0 No impairment (the person has no problem)       Getting dressed: 0 No impairment (the person has no problem)       Using the toilet: 0 No impairment (the person has no problem)       Moving around from place to place: 0 No impairment (the person has no problem)    In the past year have you fallen or had a near fall? No  Do you feel safe in your home environment? YES     INSTRUMENTAL ACTIVITIES OF DAILY LIVING:  In your present state of health how much difficulty do you have with the following activities?       Shopping: 0 No impairment (the person has no problem)  Using the telephone: 0 No impairment (the person has no problem)       Housekeeping: 0 No impairment (the person has no problem)       Laundry: 0 No impairment (the person has no problem)       Driving or using transportation (bus, taxi): 0 No impairment (the person has no problem)       Handling your own finances: 0 No impairment (the person has no problem)       Managing your own medications: 0 No impairment (the person has no problem)     SIGNS OF COGNITIVE IMPAIRMENT:   Patient report? No  Concerns raised by family members, friends, caretakers or others? No     CARDIAC RISK FACTORS:  Smoker: No  Obesity: No  Diabetic: Yes  Known heart disease: Yes  Family history of heart disease: Yes  Sedentary lifestyle: yes  Hyperlipidemia: No     Patients questionnaire responses copied into template above by Barth KirksNelson, Gilad Dugger Mark, CMA

## 2017-08-28 NOTE — Telephone Encounter (Signed)
done

## 2017-08-28 NOTE — Telephone Encounter (Signed)
Caller:[facility]  Relationship to patient:          Preferred Phone#: [phone]     OK to leave vm msg: YES                         Subject: Med interaction                       Message: Per Pharmacy, Gemfibrozil will have adverse reaction with Simvastatin.  Pharmacist inquires if both should be take together.

## 2017-08-28 NOTE — Telephone Encounter (Signed)
Routing to provider to review and advise

## 2017-09-07 ENCOUNTER — Telehealth (INDEPENDENT_AMBULATORY_CARE_PROVIDER_SITE_OTHER): Payer: Self-pay | Admitting: Family Medicine

## 2017-09-07 DIAGNOSIS — H43399 Other vitreous opacities, unspecified eye: Secondary | ICD-10-CM

## 2017-09-07 NOTE — Telephone Encounter (Signed)
Routing to provider to review and advise

## 2017-09-07 NOTE — Telephone Encounter (Signed)
Unlikely.  His is probably a separate issue.  Still recommend he talks to the pharmacist.  To see eye doctor ASAP

## 2017-09-07 NOTE — Telephone Encounter (Addendum)
Referred to eye doctor: please give schedulin instrcutions     Needs to be seen iin 1-2 days    ER if no  appointment    Please clarify which medication      To ask pharmacist if this could be related/I doubt

## 2017-09-07 NOTE — Telephone Encounter (Signed)
stopped simvastatin and started gemfibrozil. Patient wants to know if the symptoms are occurring because he started to take Gemfibrozil to early after had stop taking simvastatin.     Patient verbalized understanding in regards to referral

## 2017-09-07 NOTE — Telephone Encounter (Signed)
Patient called stating he having Right eye issues blurred vision, floaters in eye, spider webb, flashes in corner of eye.   Patient very worried about a retina detachment. Patient believes it is from changing his medication. Please call patient as soon as possible.    Call number on file.    Thank you

## 2017-09-08 ENCOUNTER — Ambulatory Visit (INDEPENDENT_AMBULATORY_CARE_PROVIDER_SITE_OTHER): Payer: Medicare HMO | Admitting: Family Medicine

## 2017-09-08 ENCOUNTER — Encounter (INDEPENDENT_AMBULATORY_CARE_PROVIDER_SITE_OTHER): Payer: Self-pay | Admitting: Family Medicine

## 2017-09-08 VITALS — BP 137/71 | HR 67 | Temp 97.9°F | Resp 18 | Wt 226.6 lb

## 2017-09-08 DIAGNOSIS — H3321 Serous retinal detachment, right eye: Secondary | ICD-10-CM

## 2017-09-08 DIAGNOSIS — E119 Type 2 diabetes mellitus without complications: Secondary | ICD-10-CM

## 2017-09-08 DIAGNOSIS — Z6831 Body mass index (BMI) 31.0-31.9, adult: Secondary | ICD-10-CM

## 2017-09-08 NOTE — Patient Instructions (Signed)
[facility]   [address]   Dr. Haskell FlirtHeffernan

## 2017-09-08 NOTE — Progress Notes (Signed)
William Bryant is a 67 year old here for the following:    Complains of visual floaters on right side- sees "squiggly lines" and "lint pieces" on right, as well as dark floating spots and flashing lights.   Now with worsening blurry vision on right side in last few days   Onset initially 6 days ago, worsening now  Nothing makes it better   No headache   No facial rash   No focal weakness or numbness    No associated fever   No cough or congestion   No eye injury     Any pertinent problem list, past medical history, surgical history, family and social history as well as medication list reviewed with patient      Vitals:    09/08/17 1301   BP: 137/71   BP Cuff Size: Large   BP Site: Left Arm   BP Position: Sitting   Pulse: 67   Resp: 18   Temp: 97.9 F (36.6 C)   TempSrc: Temporal   SpO2: 96%   Weight: (!) 226 lb 9.6 oz (102.8 kg)     PHYSICAL EXAM:  General: alert, no distress  Skin: no facial rash   Head: Normocephalic. No masses, lesions, tenderness or abnormalities  Eyes: Lids/periorbital skin normal, Conjunctivae/corneas clear, PERRL, EOM's intact  Neck: supple. No adenopathy.   Lungs: clear to auscultation    A/P:  (H33.21) Right retinal detachment  (primary encounter diagnosis)  Plan:   Flashes, floaters, dark spots in visual field, and blurry vision is highly concerning and consistent with retinal detachment. [facility] will be [facility] today.    (E11.9) Type 2 diabetes mellitus without complication, without long-term current use of insulin (HCC)  Plan: higher risk for retinal detachment

## 2017-09-08 NOTE — Telephone Encounter (Signed)
Patient verbalized understanding  

## 2017-09-16 ENCOUNTER — Ambulatory Visit (INDEPENDENT_AMBULATORY_CARE_PROVIDER_SITE_OTHER): Payer: Medicare HMO | Admitting: Cardiology

## 2017-09-16 ENCOUNTER — Encounter (INDEPENDENT_AMBULATORY_CARE_PROVIDER_SITE_OTHER): Payer: Self-pay | Admitting: Cardiology

## 2017-09-16 ENCOUNTER — Telehealth (INDEPENDENT_AMBULATORY_CARE_PROVIDER_SITE_OTHER): Payer: Self-pay | Admitting: Cardiology

## 2017-09-16 VITALS — BP 122/60 | HR 80 | Ht 71.0 in | Wt 223.4 lb

## 2017-09-16 DIAGNOSIS — I10 Essential (primary) hypertension: Secondary | ICD-10-CM

## 2017-09-16 DIAGNOSIS — E782 Mixed hyperlipidemia: Secondary | ICD-10-CM

## 2017-09-16 DIAGNOSIS — I700 Atherosclerosis of aorta: Secondary | ICD-10-CM

## 2017-09-16 DIAGNOSIS — Z6831 Body mass index (BMI) 31.0-31.9, adult: Secondary | ICD-10-CM

## 2017-09-16 MED ORDER — PRAVASTATIN SODIUM 20 MG OR TABS
20.0000 mg | ORAL_TABLET | Freq: Every day | ORAL | 3 refills | Status: DC
Start: 2017-09-16 — End: 2018-09-28

## 2017-09-16 NOTE — Progress Notes (Signed)
New Patient Evaluation Note    Primary Care Provider: Jorje Guild, Hart Robinsons [place], MD    DOB:  12/22/1950    CHIEF COMPLAINT: Risk factor reduction    HISTORY OF PRESENT ILLNESS:     67 year old gentleman with a past medical history as detailed below here for evaluation.    I reviewed the patient's medical record and will summarize below.  Recent visit with PCP.  This was a Medicare wellness visit.  Hypertension, joint pain, and myalgias were managed.  Statin therapy was discontinued.  Gemfibrozil was started.    Abdominal ultrasound reviewed.  Impression  =========    Normal caliber aorta. No evidence of aneurysm. There is some mild   atherosclerotic aortic plaque.  ATTENDING RADIOLOGIST AND PAGER NUMBER  [phone] Gillis Santa MD            Specimen Collected: 07/27/17 08:04 Last Resulted: 07/27/17 08:32        We reviewed the results of the most recent lipid profile.     06/29/2017 08:50   Cholesterol (Total) 140   Triglyceride 161 (H)   Cholesterol (LDL) 63   Cholesterol (HDL) 45   Non-HDL Cholesterol 95   Cholesterol/HDL Ratio 3.1         the patient was having joint and muscle pain.  He describes aching in his right thigh.  There was improvement after discontinuation of simvastatin.    He denies any chest pain, shortness of breath, lightheadedness, dizziness, syncope, palpitations.  He does not regularly exercise, but tries to walk and take the stairs.  No exertional symptoms.    MEDICATIONS:  Current Outpatient Medications   Medication Sig Dispense Refill    Aspirin 81 MG Oral Chew Tab Take 81 mg by mouth every day.      Benazepril HCl 5 MG Oral Tab Take 1 tablet (5 mg) by mouth daily. 90 tablet 1    Cholecalciferol (D 2000) 2000 UNITS Oral Tab Take 1 Tab by mouth every day.      gemfibrozil 600 MG Oral Tab Take 1 tablet (600 mg) by mouth 2 times a day. 60 tablet 3    Glimepiride 4 MG Oral Tab Take 1 tablet (4 mg) by mouth daily. 90 tablet 1    metFORMIN 1000 MG Oral Tab TAKE 1 TABLET BY  MOUTH IN THE MORNING AND 1 & 1/2 TABS IN THE EVENING WITH  MEALS 225 tablet 3    Tamsulosin HCl 0.4 MG Oral Cap TAKE 1 CAPSULE BY MOUTH ONCE DAILY 90 capsule 2    zinc oxide 40 % External Ointment Apply to affected area on foot or feet at bedtime. 1 Tube 0     No current facility-administered medications for this visit.        ALLERGIES:  Patient has no known allergies.    PROBLEM LIST:   Patient Active Problem List    Diagnosis Date Noted    Medicare annual wellness visit, subsequent [Z00.00] 08/28/2017    Abdominal pain, LLQ [R10.32] 08/28/2017    Acute left-sided back pain [M54.9] 08/28/2017    Muscle weakness (generalised) [M62.81] 08/28/2017    Encounter for abdominal aortic aneurysm (AAA) screening [Z13.6] 06/29/2017    BMI 31.0-31.9,adult [Z68.31] 06/29/2017    Urinary retention [R33.9] 01/16/2017    Impingement syndrome of left shoulder [M75.42] 11/19/2016    Stiffness of left shoulder joint [M25.612] 11/19/2016    Tear of left glenoid labrum [S43.432A] 11/19/2016    Partial tear of left rotator  cuff [M75.112] 11/17/2016    DDD (degenerative disc disease), lumbar [M51.36] 11/17/2016    Left hip pain [M25.552] 09/04/2016    SI (sacroiliac) joint inflammation (HCC) [M46.1] 09/04/2016    Acute midline low back pain [M54.5] 09/04/2016    Disorder of left rotator cuff [M67.912] 07/24/2016    Thoracic segment dysfunction [M99.02] 05/16/2016    Peyronie disease [N48.6] 05/05/2016    Urinary frequency [R35.0] 03/31/2016    Essential hypertension [I10] 03/05/2016    Adhesive capsulitis of left shoulder [M75.02] 12/27/2015    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] 10/19/2015    Right cervical radiculopathy [M54.12] 02/17/2014    Hoarseness [R49.0] 02/17/2014    Family history of early CAD--mother 8 [Z82.49] 09/21/2013    Type 2 diabetes mellitus without complication (HCC) [E11.9] 07/20/2013    Diverticulosis of large intestine with hemorrhage [K57.31] 07/20/2013    Mixed  hyperlipidemia [E78.2] 07/20/2013    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  No alcohol abuse or drug use.      REVIEW OF SYSTEMS   CONSTITUTIONAL:  No fever, weight loss.  EYES:  No visual loss.  ENT:  No hearing loss, exudate.  CARDIOVASCULAR:  See HPI.  GI:  No melena, hematochezia, hematemesis.  SKIN:  No lesions.  GU:  No dysuria, hematuria.  MUSCULOSKELETAL:  No myalgias, no arthralgias.  NEURO:  No TIA or CVA symptoms.  ALLERGIC/IMMUNOLOGIC:  No hives or rash.  PSYCHIATRIC:  No suicidal ideations.     PHYSICAL EXAM    CONSTITUTIONAL: This is a well-nourished male in no distress.   BP 122/60    Pulse 80    Ht 5\' 11"  (1.803 m) Comment: pt states current height    Wt (!) 223 lb 6.4 oz (101.3 kg)    BMI 31.16 kg/m   NEURO/PSYCH:  Oriented x 3 with normal affect.  EYES:  Conjunctivae pink, sclerae clear.  E/N/T:  Oral mucosa pink, dentition normal.  NECK:  Jugular venous pressure normal.  Thyroid is not enlarged.  RESPIRATORY:  Respiratory effort is normal.  Lungs are clear to percussion/auscultation.  CARDIOVASCULAR:  PMI at 5th ICS/MCL.  Regular rhythm.  S1, S2 normal.  No S3, S4, murmurs or rubs.  Carotid upstrokes are brisk, without bruits.  Abdominal aorta normal to palpation, no bruit.  Femoral pulses normal, no bruits.  Pedal pulses normal bilaterally.  Extremities no edema.  GASTROINTESTINAL:  Abdomen without masses or tenderness.  No hepatosplenomegaly.  EXTREMITIES:  No clubbing or cyanosis.  MUSCULOSKELETAL:  Normal gait and muscle strength.  SKIN:  Warm and dry, no lesions.    EKG tracing: Personally reviewed by myself.  Normal.      IMPRESSION: 67 year old with hypertension, hyperlipidemia, and aortic atherosclerosis.      PLAN:     Aortic atherosclerosis-asymptomatic.  No evidence of  aneurysmal disease or stenosis.  Would only recommend risk factor modification.    Hypertension-well-controlled.  Continue current regimen.    Hyperlipidemia-given his atherosclerosis, and multiple risk factors including diabetes, I think statin therapy is indicated.  He was possibly having myalgias from simvastatin.  I would trial a lower intensity statin.  Discontinue gemfibrozil, as the addition of a fibrate has not been shown to decrease cardiovascular events.  Start pravastatin 20 mg daily.    Follow-up when necessary.  I think his primary care physician can take over the prescription pravastatin.     Electronically Signed by Lysle Rubens, MD   09/16/2017  9:01 AM    Electronically transcribed by voice recognition technology.  Although every effort was made to ensure accuracy, some transcription errors may occur.    CC: Jorje Guild, [address] [place], [place]

## 2017-09-16 NOTE — Telephone Encounter (Signed)
Received fax from [facility] informing patient on gemfibrozil and starting pravastatin increase risk of rhabdomyolysis.  Called [facility] at [phone] explained patient had an appointment with Dr Salena Saner. Pungoti today and he instructed patient to stop gemfibrozil.

## 2017-09-18 LAB — EKG 12 LEAD
Atrial Rate: 66 {beats}/min
Diagnosis: NORMAL
P Axis: 44 degrees
P-R Interval: 148 ms
Q-T Interval: 386 ms
QRS Duration: 96 ms
QTC Calculation: 404 ms
R Axis: -4 degrees
T Axis: 8 degrees
Ventricular Rate: 66 {beats}/min

## 2017-10-12 ENCOUNTER — Other Ambulatory Visit (INDEPENDENT_AMBULATORY_CARE_PROVIDER_SITE_OTHER): Payer: Self-pay | Admitting: Family Medicine

## 2017-10-12 DIAGNOSIS — E118 Type 2 diabetes mellitus with unspecified complications: Secondary | ICD-10-CM

## 2017-10-13 MED ORDER — METFORMIN HCL 1000 MG OR TABS
ORAL_TABLET | ORAL | 1 refills | Status: DC
Start: 2017-10-13 — End: 2018-02-16

## 2017-10-14 ENCOUNTER — Telehealth (INDEPENDENT_AMBULATORY_CARE_PROVIDER_SITE_OTHER): Payer: Self-pay | Admitting: Family Medicine

## 2017-10-21 NOTE — Telephone Encounter (Signed)
Sent message for patient to make follow up appointment with Dr. Esmeralda Arthur.

## 2017-10-29 ENCOUNTER — Ambulatory Visit (INDEPENDENT_AMBULATORY_CARE_PROVIDER_SITE_OTHER): Payer: Medicare HMO | Admitting: Family Medicine

## 2017-10-29 VITALS — BP 110/68 | HR 82 | Wt 226.0 lb

## 2017-10-29 DIAGNOSIS — Z23 Encounter for immunization: Secondary | ICD-10-CM | POA: Insufficient documentation

## 2017-10-29 DIAGNOSIS — I10 Essential (primary) hypertension: Secondary | ICD-10-CM

## 2017-10-29 DIAGNOSIS — H43391 Other vitreous opacities, right eye: Secondary | ICD-10-CM

## 2017-10-29 DIAGNOSIS — E782 Mixed hyperlipidemia: Secondary | ICD-10-CM

## 2017-10-29 DIAGNOSIS — E119 Type 2 diabetes mellitus without complications: Secondary | ICD-10-CM

## 2017-10-29 DIAGNOSIS — M25511 Pain in right shoulder: Secondary | ICD-10-CM | POA: Insufficient documentation

## 2017-10-29 DIAGNOSIS — G8929 Other chronic pain: Secondary | ICD-10-CM

## 2017-10-29 DIAGNOSIS — Z6831 Body mass index (BMI) 31.0-31.9, adult: Secondary | ICD-10-CM

## 2017-10-29 MED ORDER — CYCLOBENZAPRINE HCL 5 MG OR TABS
5.0000 mg | ORAL_TABLET | Freq: Every evening | ORAL | 0 refills | Status: AC | PRN
Start: 2017-10-29 — End: ?
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
## 2017-10-29 NOTE — Patient Instructions (Signed)
Patient Education     MyPlate Worksheet: 2,400 Calories  Your calorie needs are about 2,400 calories a day. Below are the U[place] Department of Agriculture Architect) guidelines for your daily recommended amount of each food group.  Vegetables  3 cups Fruits  2 cups Grains  8 ounces Dairy  3 cups Protein  6 ounces   Eat a variety of vegetables each day.  Aim for these amounts each week:   2 cups dark green vegetables   6 cups red or orange-colored vegetables   2 cups dry beans and peas   6 cups starchy vegetables   5 cups other vegetables Eat a variety of fruits each day.  Go easy on fruit juices.  Good choices of fruits include:   Berries   Bananas   Apples   Melon   Dried fruit   Frozen fruit   Canned fruit Choose whole grains whenever you can.  Aim to eat at least 4 ounces of whole grains each day:   Bread   Cereal   Rice   Pasta   Potatoes   Tortillas Choose low-fat or fat-free milk, yogurt, or cheese each day.  Good choices include:   Low-fat or fat-free milk or chocolate milk   Low-fat or fat-free yogurt   Low-fat or fat-free cottage cheese or other reduced-fat cheeses   Calcium-fortified milk alternatives Choose low-fat or lean meats, poultry, fish and seafood each day.  Vary your protein. Choose more:   Fish and other seafood   Lean low-fat meat and poultry   Eggs   Beans, peas   Tofu   Unsalted nuts and seeds  Choose less high-fat and red meat.   Source: [employer], www.choosemyplate.gov  Know your limits on oils (fats) and sugars   Your allowance for oils is 31 grams or 7 teaspoons a day (oil includes vegetable oil, mayonnaise, soft margarine, salad dressing, nuts, olives, avocados, and some fish).   Limit the extras (solid fats and sugars, also called "empty calories") to 350 calories a day.   Cut back on salt (sodium). Stay under 2,300 mg sodium a day. If you have a health condition such as heart disease or high blood pressure, your doctor will likely tell you to limit sodium  to no more than 1,500 mg a day.  Get moving and be active!  Aim for at least 30 minutes of physical activity most days of the week or 150 minutes of exercise a week.  MyPlate servings worksheet: 2,400 calories  This worksheet tells you how many servings you should get each day from each food group, and tells you how much food makes a serving. Use this as a guide as you plan your meals throughout the day. Track your progress daily by writing in what you actually ate.  Food Group Daily MyPlate Goal What You Ate Today   Vegetables 6 half-cups or 6 servings  One serving is:   cup cut-up raw or cooked vegetables  1 cup raw, leafy vegetables   baked sweet potato   cup vegetable juice  Note: At meals, fill half your plate with vegetables and fruit.    Fruits 4 half-cups or 4 servings  One serving is:   cup fresh, frozen, or canned fruit  1 medium piece of fruit  1 cup of berries or melon   cup dried fruit   cup 100% fruit juice  Note: Make most choices fruit instead of juice.    Grains 8 servings or 8  ounces  One serving is:  1 slice bread  1 cup dry cereal   cup cooked rice, pasta, or cereal  1 5-inch tortilla  Note: Choose whole grains for at least half of your servings each day.    Dairy 3 servings or 3 cups  One serving is:  1 cup milk  1 ounces reduced-fat hard cheese  2 ounces processed cheese  1 cup low-fat yogurt  1/3 cup shredded cheese  Note: Choose low-fat or fat-free most often.    Protein 6 servings or 6 ounces  One serving is:  1 ounce cooked lean beef, pork, lamb, or ham  1 ounce cooked chicken or [place] (no skin)  1 ounce cooked fish or shellfish (not fried)  1 egg   cup egg substitute   ounce nuts or seeds  1 tablespoon peanut or almond butter   cup cooked dry beans or peas   cup tofu  2 tablespoons hummus    Date Last Reviewed: 07/14/2015   2000-2018 [employer], [place]. [address] [place], [place] [address]. All rights reserved. This information is not intended as a  substitute for professional medical care. Always follow your healthcare professional's instructions.           Patient Education     Low-Fat Cooking Tips     Broil fish instead of frying it. And sprinkle on herbs to add flavor.      To eat less fat, you may need to learn some new ways to cook. But that doesn't mean you have to eat bland, boring food. And it doesn't mean cooking needs to take any more time. Here are some tips for cooking and seasoning foods with less fat.  Try new cooking methods   Broil, roast, bake, steam, or microwave fish, chicken, [place], and other meats.   Remove skin from chicken and [place] and trim extra fat from meat before cooking.   Sprinkle herbs on meat, chicken, and fish, and in soups.   Cook in broth instead of fat.   Use nonstick cooking sprays or nonstick pans.   Steam or microwave vegetables without adding fat. Serve with herbs, lemon juice, vinegar, or fat-free butter-flavored powder.   To flavor beans and rice, add chopped onions, garlic, and peppers.   Chill soups and stews. Before reheating and serving, skim off the fat.   When you add fat, use canola or olive oil instead of butter or lard.  Lighten up your recipes   In soups and sauces: Replace whole milk or cream with low-fat milk, evaporated fat-free milk, or nonfat dry milk.   In puddings and other desserts: Replace whole milk or cream with low-fat milk or fat-free condensed milk.   To make dips and toppings: Use low-fat or nonfat cottage cheese or sour cream.   To make salad dressings: Use nonfat yogurt or low-fat buttermilk.   In place of 1 whole egg in recipes: Use 2 egg whites or 1/4 cup egg substitute.   In place of regular cheese: Use fat-free or reduced-fat cheese.  Date Last Reviewed: 06/14/2015   2000-2018 [employer], [place]. [address], [place], [place] [address]. All rights reserved. This information is not intended as a substitute for professional medical care. Always follow your  healthcare professional's instructions.           Patient Education     Healthy Meals for Diabetes     A healthcare provider will help you develop a meal  plan that fits your needs.     Ask your healthcare team to help you make a meal plan that fits your needs. Your meal plan tells you when to eat your meals and snacks, what kinds of foods to eat, and how much of each food to eat. You dont have to give up all the foods you like. But you do need to follow some guidelines.  Choose healthy carbohydrates  Starches, sugars, and fiber are all types of carbohydrates.Fiber can help lower your cholesterol and triglycerides. Fiber is also healthy for your heart. You should have 20 to 35 grams of total fiber each day. Fiber-rich foods include:   Whole-grain breads and cereals   Bulgur wheat   Brown rice    Whole-wheat pasta   Fruits and vegetables   Dry beans, and peas   Keep track of the amount of carbohydrates you eat. This can help you keep the right balance of physical activity and medicine. The amount of carbohydrates needed will vary for each person. It depends on many things such as your health, the medicines you take, and how active you are. Your healthcare team will help you figure out the right amount of carbohydrates for you. You may start with around 45 to 60 grams of carbohydrates per meal, depending on your situation.  Here are some examples of foods containing about 15 grams of carbohydrates (1 serving of carbohydrates):   1/2 cup of canned or frozen fruit   A small piece of fresh fruit (4 ounces)   1 slice of bread   1/2 cup of oatmeal   1/3 cup of rice   4 to 6 crackers   1/2 English muffin   1/2 cup of black beans   1/4 of a large baked potato (3 ounces)   2/3 cup of plain fat-free yogurt   1 cup of soup   1/2 cup of casserole   6 chicken nuggets   2-inch-square brownie or cake without frosting   2 small cookies   1/2 cup of ice cream or sherbet  Choose healthy protein foods  Eating  protein that is low in fat can help you control your weight. It also helps keep your heart healthy. Low-fat protein foods include:   Fish   Plant proteins, such as dry beans and peas, nuts, and soy products like tofu and soymilk   Lean meat with all visible fat removed   Poultry with the skin removed   Low-fat ornonfat milk, cheese, and yogurt  Limit unhealthy fats and sugar  Saturated and trans fats are unhealthy for your heart. They raise LDL (bad) cholesterol. Fat is also high in calories, so it can make you gain weight. To cut down on unhealthy fats and sugar, limit these foods:   Butter or margarine   Palm and palm kernel oils and coconut oil   Cream   Cheese   Bacon   Lunch meats    Ice cream   Sweet bakery goods such as pies, muffins, and donuts   Jams and jellies   Candy bars   Regular sodas   How much to eat  The amount of food you eat affects your blood sugar. It also affects your weight. Your healthcare team will tell you how much of each type of food you should eat.   Use measuring cups and spoons and afood scale to measure serving sizes.   Learn what a correct serving size looks like on your plate. This will  help when you are away from home and cant measure your servings.   Eat only the number of servings given on your meal plan for each food. Dont take seconds.   Learn to read food labels. Be sure to look at serving size, total carbohydrates, fiber, calories, sugar, and saturated and trans fats. Look for healthier alternatives to foods that have added sugar.   Plan ahead for parties so you can still have a good time without going overboard with unhealthy food choices. Set a good example yourself by bringing a healthy dish to pot lucks.  Choose healthy snacks  When it comes to snacks, we usually think about foods with added sugar and fats. But there are many other options for healthier snack choices. Here are a few snack ideas to choose from:  Snacks with less than 5 grams of  carbohydrates   1 piece of string cheese   3 celery sticks plus 1 tablespoon of peanut butter   5 cherry tomatoes plus 1 tablespoon of ranch dressing   1 hard-boiled egg   1/4 cup of fresh blueberries   5 baby carrots   1 cup of light popcorn   1/2 cup of sugar-free gelatin   15 almonds  Snacks with about 10 to 20 grams of carbohydrates   1/3 cup of hummus plus 1 cup of fresh cut nonstarchy vegetables (carrots, green peppers, broccoli, celery, or a combination)   1/2 cup of fresh or canned fruit plus 1/4 cup of cottage cheese   1/2 cup of tuna salad with 4 crackers   2 rice cakes and a tablespoon of peanut butter   1 small apple or orange   3 cups light popcorn   1/2 of a [place] sandwich (1 slice of whole-wheat bread, 2 ounces of [place], and mustard)  Portion sizes are important to controlling your blood sugar and staying at a healthy weight. Stock up on healthy snack items so you always have them on hand.  When to eat  Your meal plan will likely include breakfast, lunch, dinner, and some snacks.   Try to eat your meals and snacks at about the same times each day.   Eat all your meals and snacks. Skipping a meal or snack can make your blood sugar drop too low. It can also cause you to eat too much at the next meal or snack. Then your blood sugar could get too high.  Date Last Reviewed: 07/14/2014   2000-2018 [employer], [place]. [address] [place], [place] [address]. All rights reserved. This information is not intended as a substitute for professional medical care. Always follow your healthcare professional's instructions.

## 2017-11-04 ENCOUNTER — Encounter (INDEPENDENT_AMBULATORY_CARE_PROVIDER_SITE_OTHER): Payer: Self-pay | Admitting: Family Medicine

## 2017-11-04 ENCOUNTER — Ambulatory Visit (HOSPITAL_BASED_OUTPATIENT_CLINIC_OR_DEPARTMENT_OTHER): Payer: Medicare HMO

## 2017-11-04 ENCOUNTER — Ambulatory Visit: Payer: Medicare HMO | Attending: Family Medicine

## 2017-11-04 DIAGNOSIS — M25511 Pain in right shoulder: Secondary | ICD-10-CM | POA: Insufficient documentation

## 2017-11-04 DIAGNOSIS — E119 Type 2 diabetes mellitus without complications: Secondary | ICD-10-CM | POA: Insufficient documentation

## 2017-11-04 DIAGNOSIS — E782 Mixed hyperlipidemia: Secondary | ICD-10-CM | POA: Insufficient documentation

## 2017-11-04 DIAGNOSIS — I10 Essential (primary) hypertension: Secondary | ICD-10-CM | POA: Insufficient documentation

## 2017-11-04 DIAGNOSIS — G8929 Other chronic pain: Secondary | ICD-10-CM

## 2017-11-04 LAB — COMPREHENSIVE METABOLIC PANEL
ALT (GPT): 24 U/L (ref 10–48)
AST (GOT): 15 U/L (ref 9–38)
Albumin: 4.5 g/dL (ref 3.5–5.2)
Alkaline Phosphatase (Total): 54 U/L (ref 36–161)
Anion Gap: 11 (ref 4–12)
Bilirubin (Total): 1.2 mg/dL (ref 0.2–1.3)
Calcium: 9.2 mg/dL (ref 8.9–10.2)
Carbon Dioxide, Total: 26 meq/L (ref 22–32)
Chloride: 101 meq/L (ref 98–108)
Creatinine: 0.77 mg/dL (ref 0.51–1.18)
GFR, Calc, African American: >60 mL/min/{1.73_m2} (ref >59)
GFR, Calc, European American: >60 mL/min/{1.73_m2} (ref >59)
Glucose: 158 mg/dL — ABNORMAL HIGH (ref 62–125)
Potassium: 4.3 meq/L (ref 3.6–5.2)
Protein (Total): 6.5 g/dL (ref 6.0–8.2)
Sodium: 138 meq/L (ref 135–145)
Urea Nitrogen: 14 mg/dL (ref 8–21)

## 2017-11-04 LAB — LIPID PANEL
Cholesterol (LDL): 78 mg/dL (ref <130)
Cholesterol/HDL Ratio: 3.5
HDL Cholesterol: 45 mg/dL (ref >39)
Non-HDL Cholesterol: 113 mg/dL (ref 0–159)
Total Cholesterol: 158 mg/dL (ref <200)
Triglyceride: 175 mg/dL — ABNORMAL HIGH (ref <150)

## 2017-11-04 LAB — ALBUMIN/CREATININE RATIO, RANDOM URINE
Albumin (Micro), URN: <0.7 mg/dL
Albumin/Creatinine Ratio, URN: <9 mg/g{creat} (ref <30)
Creatinine/Unit, URN: 81 mg/dL

## 2017-11-04 LAB — THYROID STIMULATING HORMONE: Thyroid Stimulating Hormone: 0.673 u[IU]/mL (ref 0.400–5.000)

## 2017-11-05 ENCOUNTER — Telehealth (INDEPENDENT_AMBULATORY_CARE_PROVIDER_SITE_OTHER): Payer: Self-pay | Admitting: Family Medicine

## 2017-11-05 LAB — HEMOGLOBIN A1C, HPLC: Hemoglobin A1C: 6.8 % — ABNORMAL HIGH (ref 4.0–6.0)

## 2017-11-06 ENCOUNTER — Telehealth (INDEPENDENT_AMBULATORY_CARE_PROVIDER_SITE_OTHER): Payer: Self-pay | Admitting: Family Medicine

## 2017-11-06 NOTE — Telephone Encounter (Signed)
A1C

## 2017-11-07 NOTE — Telephone Encounter (Signed)
A1C

## 2017-11-20 NOTE — Progress Notes (Signed)
Department: Population Health Management  Insurer: [employer]   Care Gaps due: Diabetes Care - Eye exam     No patient outreach needed based on thorough chart review. Chart Review indicates PT appt is postponed based on findings that there are open referrals or orders, Diabetes Management Eye Exam   for care gaps update.. Chart indicates to re-visit on January 20 2018.  To schedule or if there are any questions, patient can call the [facility] Medicine Contact Center team at [phone].     Closing Encounter.

## 2017-11-26 ENCOUNTER — Other Ambulatory Visit (INDEPENDENT_AMBULATORY_CARE_PROVIDER_SITE_OTHER): Payer: Self-pay | Admitting: Urology

## 2017-11-26 ENCOUNTER — Other Ambulatory Visit (INDEPENDENT_AMBULATORY_CARE_PROVIDER_SITE_OTHER): Payer: Self-pay | Admitting: Family Medicine

## 2017-11-26 DIAGNOSIS — E119 Type 2 diabetes mellitus without complications: Secondary | ICD-10-CM

## 2017-11-26 DIAGNOSIS — R350 Frequency of micturition: Secondary | ICD-10-CM

## 2017-11-26 DIAGNOSIS — I10 Essential (primary) hypertension: Secondary | ICD-10-CM

## 2017-11-26 NOTE — Telephone Encounter (Signed)
11/26/2017      Refill Request    Last visit: 01/16/2017  Next visit: Visit date not found  Last refill: 03/23/2017  Last Prescribed by: Erline HauJames Downey, MD  Labs: None        Outpatient Medications Prior to Visit   Medication Sig Dispense Refill    Aspirin 81 MG Oral Chew Tab Take 81 mg by mouth every day.      Benazepril HCl 5 MG Oral Tab Take 1 tablet (5 mg) by mouth daily. 90 tablet 1    Cholecalciferol (D 2000) 2000 UNITS Oral Tab Take 1 Tab by mouth every day.      cyclobenzaprine 5 MG Oral Tablet Take 1 tablet (5 mg) by mouth at bedtime as needed for muscle spasms. 15 tablet 0    Glimepiride 4 MG Oral Tab Take 1 tablet (4 mg) by mouth daily. 90 tablet 1    metFORMIN 1000 MG Oral Tablet TAKE 1 TABLET BY MOUTH IN THE MORNING AND THEN TAKE 1 & 1/2 (ONE & ONE-HALF) TABLET IN THE EVENING WITH MEALS 225 tablet 1    pravastatin (PRAVACHOL) 20 MG Oral Tablet Take 1 tablet (20 mg) by mouth daily. 90 tablet 3    Tamsulosin HCl 0.4 MG Oral Cap TAKE 1 CAPSULE BY MOUTH ONCE DAILY 90 capsule 2    zinc oxide 40 % External Ointment Apply to affected area on foot or feet at bedtime. 1 Tube 0     No facility-administered medications prior to visit.        Inge RiseJamie L Ketra Duchesne, CMA

## 2017-11-27 MED ORDER — BENAZEPRIL HCL 5 MG OR TABS
ORAL_TABLET | ORAL | 1 refills | Status: DC
Start: 2017-11-27 — End: 2018-05-10

## 2017-11-27 MED ORDER — TAMSULOSIN HCL 0.4 MG OR CAPS
ORAL_CAPSULE | ORAL | 0 refills | Status: DC
Start: 2017-11-27 — End: 2018-02-09

## 2017-11-27 MED ORDER — GLIMEPIRIDE 4 MG OR TABS
ORAL_TABLET | ORAL | 1 refills | Status: DC
Start: 2017-11-27 — End: 2018-05-10

## 2018-02-09 ENCOUNTER — Ambulatory Visit (INDEPENDENT_AMBULATORY_CARE_PROVIDER_SITE_OTHER): Payer: Medicare HMO | Admitting: Urology

## 2018-02-09 ENCOUNTER — Encounter (INDEPENDENT_AMBULATORY_CARE_PROVIDER_SITE_OTHER): Payer: Self-pay | Admitting: Urology

## 2018-02-09 VITALS — HR 76 | Resp 18 | Ht 71.0 in | Wt 237.0 lb

## 2018-02-09 DIAGNOSIS — N138 Other obstructive and reflux uropathy: Secondary | ICD-10-CM

## 2018-02-09 DIAGNOSIS — N401 Enlarged prostate with lower urinary tract symptoms: Secondary | ICD-10-CM

## 2018-02-09 DIAGNOSIS — Z6833 Body mass index (BMI) 33.0-33.9, adult: Secondary | ICD-10-CM

## 2018-02-09 DIAGNOSIS — R350 Frequency of micturition: Secondary | ICD-10-CM

## 2018-02-09 MED ORDER — TAMSULOSIN HCL 0.4 MG OR CAPS
0.4000 mg | ORAL_CAPSULE | Freq: Every day | ORAL | 3 refills | Status: DC
Start: 2018-02-09 — End: 2019-02-04

## 2018-02-09 NOTE — Progress Notes (Signed)
UROLOGY CLINIC NOTE    Reason for Visit:   Chief Complaint   Patient presents with   . Prostate Problem     annual BPH f/u     History of Present Illness: William Bryant is a 68 year old male who returns to me today.  He is here for his annual follow-up on his BPH.  He had several questions today about his prostate and whether or not he is needing more intervention.  He just has some times when he has a weak stream but most the time he is quite comfortable.  He drinks a lot of fluids.        A 14 system review of systems was completed by the patient and was reviewed no new medical problems since last year.     Active Meds:   Outpatient Medications Marked as Taking for the 02/09/18 encounter (Office Visit) with Gwenlyn Fudge, MD   Medication Sig Dispense Refill   . Aspirin 81 MG Oral Chew Tab Take 81 mg by mouth every day.     Marland Kitchen BENAZEPRIL 5 MG tablet TAKE 1 TABLET BY MOUTH ONCE DAILY 90 tablet 1   . Cholecalciferol (D 2000) 2000 UNITS Oral Tab Take 1 Tab by mouth every day.     . cyclobenzaprine 5 MG Oral Tablet Take 1 tablet (5 mg) by mouth at bedtime as needed for muscle spasms. 15 tablet 0   . GLIMEPIRIDE 4 MG tablet TAKE 1 TABLET BY MOUTH ONCE DAILY 90 tablet 1   . metFORMIN 1000 MG Oral Tablet TAKE 1 TABLET BY MOUTH IN THE MORNING AND THEN TAKE 1 & 1/2 (ONE & ONE-HALF) TABLET IN THE EVENING WITH MEALS 225 tablet 1   . pravastatin (PRAVACHOL) 20 MG Oral Tablet Take 1 tablet (20 mg) by mouth daily. 90 tablet 3   . tamsulosin 0.4 MG capsule Take 1 capsule (0.4 mg) by mouth daily. 90 capsule 3     Allergies: Review of patient's allergies indicates:  No Known Allergies    OBJECTIVE:  Physical Exam: Pulse 76   Resp 18   Ht 5\' 11"  (1.803 m) Comment: patient reported  Wt (!) 237 lb (107.5 kg)   BMI 33.05 kg/m   Constitutional: WDWN, pleasant and appropriate affect and no acute distress  Abdomen: No abdominal masses or tenderness, no hepatospleenomegaly, no CVA tenderness and no hernias noted  GU  Exam: Circumcised penis with no lesions noticed and normal glands. Urethral meatus normal in location and size. No urethral discharge  Testes descended bilaterally. No scrotal rash or lesions noticed. Epidimymis normal to palpation. Perineum normal to visual inspection, no erythema or irritation, sphincter with good tone, rectum with no hemorrhoids, fissures or masses, prostate smooth, symmetric, anodular, approximately 40 grams in size     ASSESSMENT/PLAN:    He seems to be doing quite well on tamsulosin.  I do not recommend increasing the dose.  We talked about finasteride and surgical options.  He does not want to do anything different at this time.  His PSA is well within normal range.  He is working on getting his diabetes under better control.  All this will help.  Follow-up annually.

## 2018-02-15 ENCOUNTER — Other Ambulatory Visit (INDEPENDENT_AMBULATORY_CARE_PROVIDER_SITE_OTHER): Payer: Self-pay | Admitting: Family Medicine

## 2018-02-15 DIAGNOSIS — E118 Type 2 diabetes mellitus with unspecified complications: Secondary | ICD-10-CM

## 2018-02-16 MED ORDER — METFORMIN HCL 1000 MG OR TABS
ORAL_TABLET | ORAL | 0 refills | Status: DC
Start: 2018-02-16 — End: 2018-02-20

## 2018-02-17 ENCOUNTER — Telehealth (INDEPENDENT_AMBULATORY_CARE_PROVIDER_SITE_OTHER): Payer: Self-pay | Admitting: Family Medicine

## 2018-02-17 DIAGNOSIS — E119 Type 2 diabetes mellitus without complications: Secondary | ICD-10-CM

## 2018-02-17 DIAGNOSIS — I10 Essential (primary) hypertension: Secondary | ICD-10-CM

## 2018-02-17 NOTE — Telephone Encounter (Signed)
RETURN CALL: Voicemail - Detailed Message      SUBJECT:  Referral Request/Questions      REASON FOR REFERRAL: Vision evaluation  NAME OF CLINIC/SPECIALTY: [facility]  PROVIDER: Jake Samples OD  PHONE: [phone]  FAX: [phone] Attention Reed  ADDITIONAL INFORMATION: please send referral as patient has appointment on 02/24/18 thank yopu
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
## 2018-02-17 NOTE — Telephone Encounter (Signed)
Referral pended.    Routing to provider to review and advise.

## 2018-02-18 ENCOUNTER — Ambulatory Visit: Payer: Medicare HMO | Attending: Family Medicine

## 2018-02-18 ENCOUNTER — Ambulatory Visit (INDEPENDENT_AMBULATORY_CARE_PROVIDER_SITE_OTHER): Payer: Medicare HMO | Admitting: Family Medicine

## 2018-02-18 VITALS — BP 134/72 | HR 74 | Wt 226.0 lb

## 2018-02-18 DIAGNOSIS — H43393 Other vitreous opacities, bilateral: Secondary | ICD-10-CM

## 2018-02-18 DIAGNOSIS — E782 Mixed hyperlipidemia: Secondary | ICD-10-CM | POA: Insufficient documentation

## 2018-02-18 DIAGNOSIS — E119 Type 2 diabetes mellitus without complications: Secondary | ICD-10-CM

## 2018-02-18 DIAGNOSIS — I10 Essential (primary) hypertension: Secondary | ICD-10-CM

## 2018-02-18 DIAGNOSIS — G8929 Other chronic pain: Secondary | ICD-10-CM

## 2018-02-18 DIAGNOSIS — M25511 Pain in right shoulder: Secondary | ICD-10-CM

## 2018-02-18 DIAGNOSIS — Z6831 Body mass index (BMI) 31.0-31.9, adult: Secondary | ICD-10-CM

## 2018-02-18 LAB — COMPREHENSIVE METABOLIC PANEL
ALT (GPT): 39 U/L (ref 10–48)
AST (GOT): 22 U/L (ref 9–38)
Albumin: 4.6 g/dL (ref 3.5–5.2)
Alkaline Phosphatase (Total): 50 U/L (ref 36–161)
Anion Gap: 10 (ref 4–12)
Bilirubin (Total): 1.5 mg/dL — ABNORMAL HIGH (ref 0.2–1.3)
Calcium: 9.2 mg/dL (ref 8.9–10.2)
Carbon Dioxide, Total: 26 meq/L (ref 22–32)
Chloride: 102 meq/L (ref 98–108)
Creatinine: 0.77 mg/dL (ref 0.51–1.18)
GFR, Calc, African American: >60 mL/min/{1.73_m2} (ref >59)
GFR, Calc, European American: >60 mL/min/{1.73_m2} (ref >59)
Glucose: 159 mg/dL — ABNORMAL HIGH (ref 62–125)
Potassium: 4.3 meq/L (ref 3.6–5.2)
Protein (Total): 6.4 g/dL (ref 6.0–8.2)
Sodium: 138 meq/L (ref 135–145)
Urea Nitrogen: 15 mg/dL (ref 8–21)

## 2018-02-18 LAB — LIPID PANEL
Cholesterol (LDL): 74 mg/dL (ref <130)
Cholesterol/HDL Ratio: 3.6
HDL Cholesterol: 42 mg/dL (ref >39)
Non-HDL Cholesterol: 110 mg/dL (ref 0–159)
Total Cholesterol: 152 mg/dL (ref <200)
Triglyceride: 178 mg/dL — ABNORMAL HIGH (ref <150)

## 2018-02-18 LAB — ALBUMIN/CREATININE RATIO, RANDOM URINE
Albumin (Micro), URN: 0.7 mg/dL
Albumin/Creatinine Ratio, URN: 7 mg/g{creat} (ref <30)
Creatinine/Unit, URN: 106 mg/dL

## 2018-02-18 LAB — THYROID STIMULATING HORMONE: Thyroid Stimulating Hormone: 0.641 u[IU]/mL (ref 0.400–5.000)

## 2018-02-18 NOTE — Patient Instructions (Addendum)
Scheduling Instructions:  Referral to: [facility] eye surgeons Jake Samples, [place])   Phone: [phone]   Fax: [phone]   Attention Reed     Patient Education     [facility] Joint Sprain (Adult)    [facility] or acromioclavicular joint is at the end of the collar bone, or clavicle, near the shoulder. The [facility] joint is made of 4 ligaments that hold the collar bone to the shoulder blade, or scapula. With an [facility] joint sprain, these ligaments may be partly or fully torn. In both cases, this causes pain and swelling at the end of the collar bone. If the ligaments are completely torn, the collar bone will rise up.  AC joint sprains are given a grade depending on whether they are mild, moderate, or severe:   Grade 1. A mild sprain, with minor damage to the ligaments. The collar bone stays in place.   Grade 2. A moderate sprain. The ligaments are partly torn. The collar bone is moved out of place. The injured shoulder may look lower and flatter than the other shoulder.   Grade 3. The most severe kind of sprain. The ligaments are completely torn. The collar bone is no longer joined to the shoulder blade. The collar bone rises up. This creates a bump on top of the shoulder. The ligaments heal in this position, so the bump does not go away. It is possible to have surgery to correct the bump. But normal shoulder function will usually return even without surgery.  An AC sprain will take up to 6 weeks or longer to heal, depending on how severe it is. It is often treated with a sling. Or a sling and an elastic wrap around the chest may be used.Physical therapy may be needed to help the shoulder keep full range of motion.Once healed, you can usually expect full recovery of shoulder function.  Home care   Your provider may prescribe medicines for pain. Or you may use over-the-counter pain medicines. Talk with your provider before taking medicines if you have chronic liver or kidney disease, or have ever had a stomach ulcer or  gastrointestinal bleeding.   Make an appointment right away to see your provider or an orthopedic or bone doctor, for further evaluation and treatment of the injury.   Use the injured area as little as possible. This will help decrease pain and swelling and allow the area to heal.   Place an ice pack over the injured area for15 minutes. Do this every4 to 6hoursfor the first 24 to 48 hours, or as directed. Keep using ice packs to ease pain and swelling as directed by your provider or the orthopedic doctor.   To make an ice pack, put ice cubes in a plasticbag that seals at the top. Wrap the bag in aclean, thintowel or cloth. Never put ice or an ice pack directly on the skin. The ice pack can be put right on the wrap or sling. As the ice melts, be careful to not get the wrap or sling wet.   A sling alone is often enough. Sometime a sling and a wrap around the chest may be used to help ease pain, if needed. This also helps keep your injured arm from moving. Use these devices as advised until you are seen by your healthcare provider or the orthopedic doctor. Always ask when the wrap should be worn. Always ask when the wrap can be removed.   Wearthe sling while awake or as advised, until you  are scheduled to see your healthcare provider or an orthopedic doctor.You may remove the sling to sleep. You may remove the sling to bathe. Make sure the sling is comfortable and keeps your arm raised, as advised by the provider.Follow the provider's instructions on how to use the sling. Always ask when you should wear the sling. Always ask when the sling can be removed.   Shoulder joints become stiff if they are kept still for too long. Talk with your healthcare provider or the orthopedic doctor about range of motion exercises and possible physical therapy.  Follow-up care  Follow up with your healthcare provider, or as advised. Your provider may refer you to a specialist such as an orthopedic, or bone, doctor.  If  X-rays were taken, you will be told of any new findings that may affect your care.  When to seek medical advice  Call your healthcare provider right awayif any of these occur:   Your shoulder looks off-balance   You have increased pain, swelling, or bruising   You have increased pain even after using prescribed pain medicine   You have continuing pain   Your hand or arm becomes cold, blue, numb, or tingly   You have trouble moving your shoulder, wrist, or elbow due to stiffness   Date Last Reviewed: 05/13/2016   2000-2018 [employer] [address], [place], [place] [id number]. All rights reserved. This information is not intended as a substitute for professional medical care. Always follow your healthcare professional's instructions.           Patient Education     Shoulder Exercises: Internal Rotation    Strengthening exercises help make your injured shoulder more stable. To warm up, do flexibility (stretching) exercises first. Your healthcare provider will tell you what size hand weights to use for the strengthening exercise below. If you don't have hand weights, try using cans of soup instead:   With knees bent, lie on a firm surface. Using the hand on the same side as your injured shoulder, grasp a weight. Bend that arm to a right angle (90 degrees).   Rest your elbow on the floor.   Keeping your elbow next to your side, lower your forearm toward the floor, away from your body. Don't lower your hand all the way to the floor.   Slowly return your forearm to your side. Repeat.   Work up [employer]5 to [employer]15lifts.    Note: Support your head and neck with a pillow.   Date Last Reviewed: 03/13/2016   2000-2018 [employer] [address], [place], [place] [id number]. All rights reserved. This information is not intended as a substitute for professional medical care. Always follow your healthcare professional's instructions.           Patient Education     "Reaching Up" for Shoulder  Flexibility    This stretch can help restore shoulder flexibility and relieve pain over time. When stretching, be sure to breathe deeply. And follow any special instructions from your healthcare provider or therapist.  1. Raise the hand on the side you want to stretch as high as you can. Then grasp a stable surface, such as a bookcase or a doorframe, with the same hand.  2. Keeping your arm straight, lower your body by bending your knees. Stop when you feel the stretch in the shoulder. Try to hold the stretch for5seconds.  3. Work up to [employer] Miningdoing3sets of this stretch,3times a day. Work up to  holding the stretch for30 to 60seconds.  Note: Your back should remain straight. To enhance the stretch over time, try to bend your knees lower. Or, raise your arm higher at the start of the stretch.    Frozen shoulder  Frozen shoulder is another name for adhesive capsulitis. This causes restricted movement in the shoulder. If you have frozen shoulder, this stretch may cause mild pain, especially when you first get started. A few months may pass before you achieve the results you want. But once your shoulder heals, it rarely becomes frozen again. So stick to your stretching program. If you have any questions, ask your healthcare provider.   Date Last Reviewed: 02/14/2016   2000-2018 [employer] [address], [place], [place] [id number]. All rights reserved. This information is not intended as a substitute for professional medical care. Always follow your healthcare professional's instructions.           Patient Education     Diverticulitis    Some people get pouches along the wall of the colon as they get older. The pouches,called diverticuli, usually cause no symptoms. If the pouches become blocked, you can get an infection. This infection is called diverticulitis. It causes pain in your lower abdomen and fever. If not treated, it can become a serious condition, causing an abscess to form inside the pouch.  The abscess may block the intestinal tract even or rupture, spreading infection throughout the abdomen.  When treatment is started early, oral antibiotics alone may be enough to cure diverticulitis. This method is tried first. But, if you don't improve or if your condition gets worse while using oral antibiotics, you may need to be admitted to the hospital for IV antibiotics. Severe cases may require surgery.  Home care  The following guidelines will help you care for yourself at home:   During the acute illness, rest and follow your healthcareprovider's instructions about diet. Sometimes you will need to follow a clear liquid diet to rest your bowel. Once your symptoms are better, you may be told to follow a low-fiber diet for some time. Include foods like:  ? Flake cereal, mashed potatoes, pancakes, waffles, pasta, white bread, rice, applesauce, bananas, eggs, fish, poultry, tofu, and cooked soft vegetables   Take antibiotics exactly as instructed. Don't miss any doses or stop taking the medication, even if you feel better.   Monitor your temperature and tell your healthcare provider if you have rising temperatures.  Preventing future attacks  Once you have an episode of diverticulitis, you are at risk for having it again. After you have recovered from this episode, you may be able to lower your risk by eating a high-fiber diet (20 gm/day to 35 gm/day of fiber). This cleans out the colon pouches that already exist and may prevent new ones from forming. Foods high in fiber include fresh fruits and edible peelings, raw or lightly cooked vegetables, whole grain cereals and breads, dried beans and peas, and bran.  Other steps that can help prevent future attacks include:   Take your medicines, such as antibiotics, asyour healthcare provider says.   Drink 6 to 8 glasses of water every day, unless told otherwise.   Use a heating pad or hot water bottle to help abdominal cramping or pain.   Begin an exercise  program. Ask your healthcare provider how to get started. You can benefit from simple activities such as walking or gardening.   Treat diarrhea with a bland diet. Start with  liquids only; then slowly add fiber over time.   Watch for changes in your bowel movements (constipation to diarrhea). Avoid constipation by eating a high fiber diet and taking a stool softener if needed.   Get plenty of rest and sleep.  Follow-up care  Follow up with your healthcare provider as advised or sooner if you are not getting better in the next 2days.  When to seek medical advice  Call your healthcare provider right away if any of these occur:   Fever of 100.56F (38C) or higher, or as directed by your healthcare provider   Repeated vomiting or swelling of the abdomen   Weakness, dizziness, light-headedness   Pain in your abdomen that gets worse, severe, or spreads to your back   Pain that moves to the right lower abdomen   Rectal bleeding (stools that are red, black or maroon color)   Unexpected vaginal bleeding  Date Last Reviewed: 09/14/2014   2000-2018 [employer], [place]. [address] [place], [place] [address]. All rights reserved. This information is not intended as a substitute for professional medical care. Always follow your healthcare professional's instructions.

## 2018-02-19 LAB — HEMOGLOBIN A1C, HPLC: Hemoglobin A1C: 8.5 % — ABNORMAL HIGH (ref 4.0–6.0)

## 2018-02-20 ENCOUNTER — Encounter (INDEPENDENT_AMBULATORY_CARE_PROVIDER_SITE_OTHER): Payer: Self-pay | Admitting: Family Medicine

## 2018-02-20 DIAGNOSIS — E119 Type 2 diabetes mellitus without complications: Secondary | ICD-10-CM

## 2018-02-20 MED ORDER — SITAGLIPTIN PHOSPHATE 50 MG OR TABS
50.0000 mg | ORAL_TABLET | Freq: Every day | ORAL | 1 refills | Status: DC
Start: 2018-02-20 — End: 2018-10-29

## 2018-02-20 MED ORDER — METFORMIN HCL 850 MG OR TABS
850.0000 mg | ORAL_TABLET | Freq: Three times a day (TID) | ORAL | 3 refills | Status: DC
Start: 2018-02-20 — End: 2018-05-12

## 2018-05-08 ENCOUNTER — Other Ambulatory Visit (INDEPENDENT_AMBULATORY_CARE_PROVIDER_SITE_OTHER): Payer: Self-pay | Admitting: Family Medicine

## 2018-05-08 DIAGNOSIS — E119 Type 2 diabetes mellitus without complications: Secondary | ICD-10-CM

## 2018-05-08 DIAGNOSIS — I10 Essential (primary) hypertension: Secondary | ICD-10-CM

## 2018-05-10 MED ORDER — GLIMEPIRIDE 4 MG OR TABS
ORAL_TABLET | ORAL | 0 refills | Status: DC
Start: 2018-05-10 — End: 2018-10-29

## 2018-05-10 MED ORDER — BENAZEPRIL HCL 5 MG OR TABS
ORAL_TABLET | ORAL | 0 refills | Status: DC
Start: 2018-05-10 — End: 2018-10-21

## 2018-05-12 ENCOUNTER — Other Ambulatory Visit (INDEPENDENT_AMBULATORY_CARE_PROVIDER_SITE_OTHER): Payer: Self-pay | Admitting: Family Medicine

## 2018-05-12 DIAGNOSIS — E119 Type 2 diabetes mellitus without complications: Secondary | ICD-10-CM

## 2018-05-12 MED ORDER — METFORMIN HCL 850 MG OR TABS
850.0000 mg | ORAL_TABLET | Freq: Three times a day (TID) | ORAL | 1 refills | Status: DC
Start: 2018-05-12 — End: 2018-09-28

## 2018-08-27 ENCOUNTER — Telehealth (INDEPENDENT_AMBULATORY_CARE_PROVIDER_SITE_OTHER): Payer: Self-pay

## 2018-08-27 DIAGNOSIS — E119 Type 2 diabetes mellitus without complications: Secondary | ICD-10-CM

## 2018-08-27 NOTE — Telephone Encounter (Signed)
Diabetes Care Gap Assessment:  Due for:  A1C: overdue since 02/18/18 was 8.5  Foot exam: overdue since  06/29/17  Last PCP follow up: last on 02/18/18. Over due  DM nutrition referral: in 2018, expired as pt did not pursue.    Pt due for PCP follow up. Please schedule.  Pended labs

## 2018-08-27 NOTE — Telephone Encounter (Signed)
Needs Office visit  Due for other labs as well    Dr. Wilhemena Durie

## 2018-08-30 NOTE — Telephone Encounter (Signed)
ecare message sent to set up appointment to follow up and do lab

## 2018-08-30 NOTE — Telephone Encounter (Signed)
Routing to front desk pool to assist with scheduling.

## 2018-09-27 ENCOUNTER — Other Ambulatory Visit (INDEPENDENT_AMBULATORY_CARE_PROVIDER_SITE_OTHER): Payer: Self-pay | Admitting: Family Medicine

## 2018-09-27 DIAGNOSIS — E782 Mixed hyperlipidemia: Secondary | ICD-10-CM

## 2018-09-27 DIAGNOSIS — E119 Type 2 diabetes mellitus without complications: Secondary | ICD-10-CM

## 2018-09-28 MED ORDER — PRAVASTATIN SODIUM 20 MG OR TABS
ORAL_TABLET | ORAL | 0 refills | Status: DC
Start: 2018-09-28 — End: 2018-12-27

## 2018-09-28 MED ORDER — METFORMIN HCL 850 MG OR TABS
ORAL_TABLET | ORAL | 0 refills | Status: DC
Start: 2018-09-28 — End: 2018-10-29

## 2018-09-28 NOTE — Telephone Encounter (Signed)
Patient last seen on 02/18/18 and was to return in 4 months.  One refill (90 days) authorized.  Please schedule follow up visit.

## 2018-09-29 NOTE — Telephone Encounter (Signed)
LVM for patient to call back awaiting for call back, scheduling for 6 month follow up

## 2018-10-19 ENCOUNTER — Other Ambulatory Visit (INDEPENDENT_AMBULATORY_CARE_PROVIDER_SITE_OTHER): Payer: Self-pay | Admitting: Family Medicine

## 2018-10-19 DIAGNOSIS — I10 Essential (primary) hypertension: Secondary | ICD-10-CM

## 2018-10-21 MED ORDER — BENAZEPRIL HCL 5 MG OR TABS
ORAL_TABLET | ORAL | 0 refills | Status: DC
Start: 2018-10-21 — End: 2019-02-02

## 2018-10-29 ENCOUNTER — Ambulatory Visit (INDEPENDENT_AMBULATORY_CARE_PROVIDER_SITE_OTHER): Payer: Medicare HMO | Admitting: Family Medicine

## 2018-10-29 ENCOUNTER — Encounter (INDEPENDENT_AMBULATORY_CARE_PROVIDER_SITE_OTHER): Payer: Self-pay | Admitting: Family Medicine

## 2018-10-29 ENCOUNTER — Encounter (INDEPENDENT_AMBULATORY_CARE_PROVIDER_SITE_OTHER): Payer: Self-pay

## 2018-10-29 VITALS — BP 128/84 | Temp 98.1°F | Ht 71.0 in | Wt 221.6 lb

## 2018-10-29 DIAGNOSIS — E119 Type 2 diabetes mellitus without complications: Secondary | ICD-10-CM

## 2018-10-29 DIAGNOSIS — Z0000 Encounter for general adult medical examination without abnormal findings: Secondary | ICD-10-CM | POA: Insufficient documentation

## 2018-10-29 DIAGNOSIS — L603 Nail dystrophy: Secondary | ICD-10-CM

## 2018-10-29 DIAGNOSIS — E782 Mixed hyperlipidemia: Secondary | ICD-10-CM

## 2018-10-29 DIAGNOSIS — Z136 Encounter for screening for cardiovascular disorders: Secondary | ICD-10-CM

## 2018-10-29 DIAGNOSIS — Z23 Encounter for immunization: Secondary | ICD-10-CM

## 2018-10-29 DIAGNOSIS — M2550 Pain in unspecified joint: Secondary | ICD-10-CM

## 2018-10-29 DIAGNOSIS — I10 Essential (primary) hypertension: Secondary | ICD-10-CM

## 2018-10-29 MED ORDER — GLIMEPIRIDE 4 MG OR TABS
4.0000 mg | ORAL_TABLET | Freq: Every day | ORAL | 3 refills | Status: AC
Start: 2018-10-29 — End: ?

## 2018-10-29 MED ORDER — METFORMIN HCL 850 MG OR TABS
ORAL_TABLET | ORAL | 3 refills | Status: DC
Start: 2018-10-29 — End: 2019-01-04

## 2018-10-29 NOTE — Progress Notes (Signed)
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   Eyesight changes   Drowsiness   Weakness  Call 911  Call 911 if any of these occur:    Chest pain or shortness of breath   Dizziness or fainting   Weakness of an arm or leg or one side of the face   Trouble speaking or seeing   Confusion or loss of consciousness  StayWell last reviewed this educational content on 11/13/2017   2000-2020 [employer]. [address], [place] [address]. All rights reserved. This information is not intended as a substitute for professional medical care. Always follow your healthcare professional's instructions.           Patient Education     Your Diabetes Foot Care Program    Every day you depend on your feet to keep you moving. But when you have diabetes, your feet need special care. Even a small foot problem can become very serious. So don't take your feet for granted. Work with your diabetes healthcare team. They can help you protect your feet and keep them healthy.  Assessing your feet  An assessment helps your healthcare provider check the condition of your feet. The assessment includes a review of your diabetes history and overall health. It may also include a foot exam, X-rays, or other tests. These can help show problems beneath the skin that you  can't see or feel.  Health history  You will be asked about your overall health and any history of foot problems. You'll also discuss your diabetes history, such as if your blood sugar level has changed over time. It also includes questions about feelings of pain, tingling, pins and needles, or numbness. Your healthcare provider will also want to know if you have high blood pressure and heart disease. Or if you smoke.Tell your provider about any past foot infections, medicines (including over-the-counter), vitamins, supplements, or herbs you take.  Foot exam  A foot exam checks the condition of different parts of your foot. First your skin and nails are checked for any signs of infection. Blood flow is checked by feeling for the pulses in each foot. You may also have tests to study the nerves in the foot. These include using a small wire (filament) to see how sensitive your feet are. Dry skin on your feet may be a sign of damage to nerves which control the moisture on your skin. Toe nail fungal infections may lead to more serious bacterial infections. In certain cases, you will be asked to walk a short distance. This is done to check for bone, joint, and muscle problems.  Diagnostic tests  If needed, your healthcare provider will suggest certain tests to learn more about your feet. These include:   Doppler tests. These measure blood flow in the feet and lower leg.   X-rays. These can show bone or joint problems.   Other imaging tests. These may include an MRI, bone scan, and  CT scan. These can help show bone infections.   Other tests. These may include vascular tests. These tests study the blood flow in your feet and legs. This is done by comparing the blood pressures in your arm and ankle. You may also have nerve studies to learn how sensitive your feet are.  Creating a foot care program  Based on the evaluation, your healthcare provider will create a foot care program for you. This may be as simple as  starting a daily self-care routine. And changing the types of shoes you wear. It may also include treating minor foot problems such as a corn or blister. In some cases, surgery will be needed to treat an infection. Or to treat mechanical problems, such as claw toes and hammer toes.  Preventing problems  When you have diabetes, it's easier to prevent problems than to treat them later on. So see your healthcare team for regular checkups and foot care. Your healthcare team can also help you learn more about caring for your feet at home. For example, you may be told to not walk barefoot, even in your home. Or you may be told you need special footwear to protect your feet.  Have regular checkups  Foot problems can happen quickly. So follow your healthcare team's schedule for regular checkups. During office visits, take off your shoes and socks as soon as you get in the exam room. Ask your healthcare provider to check your feet for problems. This will make it easier to find and treat small skin issues before they get worse. Regular checkups can also help keep track of the blood flow and feeling in your feet. You may have lack of feeling in your feet (neuropathy). Then you will need checkups more often.  Learn about self-care  The more you know about diabetes and your feet, the easier it will be to prevent problems. Your healthcare team can teach you how to check your feet. And teach you to look for warning signs. They can also give you other foot care tips. Before your office visits, write down any foot care questions you have. During office visits, ask any questions you have.  StayWell last reviewed this educational content on 11/13/2017   2000-2020 [employer]. [address] [place] [address]. All rights reserved. This information is not intended as a substitute for professional medical care. Always follow your healthcare professional's instructions.               Please plan to have a Subsequent  Annual Wellness Visit in 1 year.    [facility]  Physician: Ashok Pall Wilhemena Durie, William Bryant  Address: [facility]   [address] [place]  [address]   Phone: [phone]

## 2018-10-29 NOTE — Patient Instructions (Addendum)
The Annual Wellness Visit (AWV) is to encourage you to take an active role in accurately assessing and managing your health, and consequently improve your well-being and quality of life    You cardiovascular risk is high  Please continue a healthy lifestyle    The 10-year ASCVD risk score Denman George DC Montez Hageman., et al., 2013) is: 26.3%    Values used to calculate the score:      Age: 68 years      Sex: Male      Is Non-Hispanic African American: No      Diabetic: Yes      Tobacco smoker: No      Systolic Blood Pressure: 128 mmHg      Is BP treated: Yes      HDL Cholesterol: 42 mg/dL      Total Cholesterol: 152 mg/dL    Patient Education     Diabetes: Activity Tips    Being more active can help you manage your diabetes. The tips on this sheet can help you get the most from your exercise. They can also help you stay safe.   Staying active   It's important for adults to spend less time sitting and being inactive. This is especially true if you have type 2 diabetes. When you are sitting for long periods of time, get up for short sessions of light activity every 30 minutes.   Aim for at least 150 minutes a week of exercise or physical activity. That's about 30 minutes a day 5 to 7 days a week. Don't let more than 2 days go by without being active. Break up your daily activity into 10-minute blocks. Or choose a daily schedule that works for you. Make your goal 30 minutes of daily physical activity.   Benefit from briskness  Brisk activity gets your heart beating faster. This can help make you more fit, lose extra weight, and manage your blood sugar level. Try brisk walking. Or try swimming or bike riding if you have foot or leg problems. You can break up your exercise into chunks throughout the day. Work up to at [employer] minutes of steady, brisk exercise on most days.   Warm up and cool down  Warming up and cooling down reduce your risk for injury. This also helps limit muscle soreness. Do a mild version of your activity for  before and after your routine. You can also learn stretches that will help keep your muscles loose. Your healthcare provider may show you good ways to warm up and stretch.   Do the talk-sing test  The talk-sing test is a simple way to tell how hard you're exercising. If you can talk while exercising, you're in a safe range. If you're out of breath, slow down. If you can carry a tune, it's time to pick up the pace. Walk up a hill. Add more resistance on your stationary bike. Or swim faster.   What about eating?  You may be told to plan your exercise for 1 to 2 hours after a meal. In most cases, you don't need to eat while being active. Test your blood sugar before exercising if you take insulin or medicine that can cause low blood sugar. And carry a fast-acting sugar that will raise your blood sugar level quickly. This includes glucose tablets or glucose gel in a tube. Use it if you feel low blood sugar symptoms.   Safety tips  These tips can help you stay safe as you become fit:  Exercise with a friend or carry a cell phone if you have one.   Carry or wear ID such as a necklace or bracelet that says you have diabetes.   Use the correct footwear and safety equipment for your activity.   Drink water before, during, and after exercise.   Dress for the weather.   Don't exercise in very hot or very cold weather.   Don't exercise if you are sick.   Test your blood sugar before and after you exercise if you are told to do so. Have a small carbohydrate snack if your blood sugar is low before you start exercising.  When to stop exercising and call your healthcare provider  Stop exercising and call your healthcare provider right away if you notice any of the following:    Pain, pressure, tightness, or heaviness in the chest   Pain or heaviness in the neck, shoulders, back, arms, legs, or feet   Unusual shortness of breath   Dizziness or lightheadedness   Unusually rapid or slow pulse   Increased  joint or muscle pain   Nausea or vomiting  StayWell last reviewed this educational content on 11/13/2016   2000-2020 [employer]. [address], [place] [address]. All rights reserved. This information is not intended as a substitute for professional medical care. Always follow your healthcare professional's instructions.           Patient Education     Diabetes: Overview  Diabetes is a long-term health problem. It means your body doesn't make enough insulin. Or it may mean that your body can't use the insulin it makes. Insulin is a hormone in your body. It lets blood sugar (glucose) reach the cells in your body. All of your cells need glucose for fuel.   When you have diabetes, the glucose in your blood builds up because it can't get into the cells. This buildup is called high blood sugar (hyperglycemia).   Your blood sugar level depends on several things. It depends on what kind of food you eat and how much of it you eat. It also depends on how much exercise you get, and how much insulin you have in your body. Eating too much of the wrong kinds of food or not taking diabetes medicine on time can cause high blood sugar. Infections can cause high blood sugar even if you are taking medicines correctly.   These things can also cause low blood sugar:   Missing meals   Not eating enough food   Unplanned or heavy exercise   Taking too much diabetes medicine  Diabetes can cause serious problems over time if you don't get treated. These problems include:    Heart disease   Stroke   Kidney failure   Blindness   Nerve pain   Loss of feeling in the legs and feet   Tissue death (gangrene)  By keeping your blood sugar under control you can prevent or delay these problems.   Normal blood sugar levels are 80mg /dL to 100 mg/dL before a meal. They are less than 180 mg/dLin the 1 to 2 hours after a meal.   Home care  Follow these guidelines when caring for yourself at home:   Follow the diet your  healthcare provider gives you. Take insulin or other diabetes medicine exactly as told to.   Watch your blood sugar as you are told to. Keep a log of your results. This will help your provider change your medicines  to keep your blood sugar under control.   Try to reach your ideal weight. You may be able to cut back on or not have to take diabetes medicine if you eat the right foods and get exercise.   Don't smoke. Smoking worsens the effects of diabetes on your circulation. You are much more likely to have a heart attack if you have diabetes and you smoke. Also don't use e-cigarettes or vaping products.   Take good care of your feet. If you have lost feeling in your feet, you may not notice an injury or infection. Check your feet and between your toes at least once a day. Use a mirror to check the bottoms of your feet.   Wear a medical alert bracelet or necklace. Or carry a card in your wallet that says you have diabetes. This will help healthcare providers give you the right care if you get very ill and can't tell them that you have diabetes.  Sick-day plan  If you get a cold, the flu, or a bacterial or viral infection, take these steps:   Look at your diabetes sick plan and call your healthcare provider as you were told to. You may need to call your provider right away if:  ? Your blood sugar is above 240 mg/dL while taking your diabetes medicine  ? Your urine ketone levels are above normal or high  ? You have been vomiting more than 6 hours  ? You have trouble breathing or your breath has a fruity smell  ? You have a high fever  ? You have a fever for several days and you are not getting better  ? You get light-headed and are sleepier than usual   Keep taking your diabetes pills (oral medicine) even if you have been vomiting and are feeling sick. Call your provider right away. This is because you may need insulin to lower your blood sugar until you recover from your illness.   Keep taking your insulin  even if you have been vomiting and are feeling sick. Call your provider right away to ask if you need to change your insulin dose. This will depend on your blood sugar results.   Check your blood sugar every 2 to 4 hours, or at least 4 times a day.   Check your ketones often. Watch them more often if you are vomiting and having diarrhea.   Don't skip meals. Try to eat small meals on a regular schedule. Do this even if you don't feel like eating.   Drink water or other liquids that don't have caffeine or calories. This will keep you from getting dehydrated. If you are nauseated or vomiting, takes small sips every 5 minutes. To prevent dehydration try to drink a cup (8 ounces) of fluids every hour while you are awake.  General care  Always bring a source of fast-acting sugar with you in case you have symptoms of low blood sugar (below 70 mg/dL). At the first sign of low blood sugar, eat or drink 15 to 20 grams of fast-acting sugar to raise your blood sugar. Examples are:    3 to 4 glucose tablets. You can buy these at most drugstores.   4 ounces (1/2 cup) of regular (not diet) softdrinks   4 ounces (1/2 cup) of any fruit juice   5 to 6 pieces of hard candy   1 tablespoon of honey  Check your blood sugar 15 minutes after treating yourself. If it's still below 70  mg/dL, take 15 to 20 more grams of fast-acting sugar. Test again in 15 minutes. If it returns to normal (70 mg/dL or above), eat a snack or meal to keep your blood sugar in a safe range. If it stays low, call your doctor or go to an emergency room.   If you have had severe low blood sugar episodes, see that someone in your family is trained to give you a shot of glucagon. This will raise your blood sugar if you are unconscious and can't eat any of the above tablets or foods.   Follow-up care  Follow-up with your healthcare provider, or as advised. For more information about diabetes, visit the American Diabetes Association website at www.diabetes.org.  Or you can call [phone].   When to seek medical advice  Call your healthcare provider right away if you have any of these symptoms of high blood sugar that don't go away with the above treatment suggestions:    Urinating often   Drowsiness   Thirst   Headache   Nausea or vomiting   Belly (abdominal) pain   Eyesight changes   Fast breathing  Also call your provider right away if you have any of these signs of low blood sugar and they don't go away with the above treatment suggestions:    Fatigue   Headache   Shakes   Excess sweating   Hunger   Feeling anxious or restless   Eyesight changes   Drowsiness   Weakness  Call 911  Call 911 if any of these occur:    Chest pain or shortness of breath   Dizziness or fainting   Weakness of an arm or leg or one side of the face   Trouble speaking or seeing   Confusion or loss of consciousness  StayWell last reviewed this educational content on 11/13/2017   2000-2020 [employer], [place]. [address], [place], [place] [address]. All rights reserved. This information is not intended as a substitute for professional medical care. Always follow your healthcare professional's instructions.           Patient Education     Your Diabetes Foot Care Program    Every day you depend on your feet to keep you moving. But when you have diabetes, your feet need special care. Even a small foot problem can become very serious. So don't take your feet for granted. Work with your diabetes healthcare team. They can help you protect your feet and keep them healthy.  Assessing your feet  An assessment helps your healthcare provider check the condition of your feet. The assessment includes a review of your diabetes history and overall health. It may also include a foot exam, X-rays, or other tests. These can help show problems beneath the skin that you can't see or feel.  Health history  You will be asked about your overall health and any history of foot problems.  You'll also discuss your diabetes history, such as if your blood sugar level has changed over time. It also includes questions about feelings of pain, tingling, pins and needles, or numbness. Your healthcare provider will also want to know if you have high blood pressure and heart disease. Or if you smoke.Tell your provider about any past foot infections, medicines (including over-the-counter), vitamins, supplements, or herbs you take.  Foot exam  A foot exam checks the condition of different parts of your foot. First your skin and nails are checked for any signs of infection. Blood  flow is checked by feeling for the pulses in each foot. You may also have tests to study the nerves in the foot. These include using a small wire (filament) to see how sensitive your feet are. Dry skin on your feet may be a sign of damage to nerves which control the moisture on your skin. Toe nail fungal infections may lead to more serious bacterial infections. In certain cases, you will be asked to walk a short distance. This is done to check for bone, joint, and muscle problems.  Diagnostic tests  If needed, your healthcare provider will suggest certain tests to learn more about your feet. These include:   Doppler tests. These measure blood flow in the feet and lower leg.   X-rays. These can show bone or joint problems.   Other imaging tests. These may include an MRI, bone scan, and CT scan. These can help show bone infections.   Other tests. These may include vascular tests. These tests study the blood flow in your feet and legs. This is done by comparing the blood pressures in your arm and ankle. You may also have nerve studies to learn how sensitive your feet are.  Creating a foot care program  Based on the evaluation, your healthcare provider will create a foot care program for you. This may be as simple as starting a daily self-care routine. And changing the types of shoes you wear. It may also include treating minor foot  problems such as a corn or blister. In some cases, surgery will be needed to treat an infection. Or to treat mechanical problems, such as claw toes and hammer toes.  Preventing problems  When you have diabetes, it's easier to prevent problems than to treat them later on. So see your healthcare team for regular checkups and foot care. Your healthcare team can also help you learn more about caring for your feet at home. For example, you may be told to not walk barefoot, even in your home. Or you may be told you need special footwear to protect your feet.  Have regular checkups  Foot problems can happen quickly. So follow your healthcare team's schedule for regular checkups. During office visits, take off your shoes and socks as soon as you get in the exam room. Ask your healthcare provider to check your feet for problems. This will make it easier to find and treat small skin issues before they get worse. Regular checkups can also help keep track of the blood flow and feeling in your feet. You may have lack of feeling in your feet (neuropathy). Then you will need checkups more often.  Learn about self-care  The more you know about diabetes and your feet, the easier it will be to prevent problems. Your healthcare team can teach you how to check your feet. And teach you to look for warning signs. They can also give you other foot care tips. Before your office visits, write down any foot care questions you have. During office visits, ask any questions you have.  StayWell last reviewed this educational content on 11/13/2017   2000-2020 [employer] [address], [place], [place] [id number]. All rights reserved. This information is not intended as a substitute for professional medical care. Always follow your healthcare professional's instructions.

## 2018-10-29 NOTE — Progress Notes (Signed)
MEDICARE PREVENTIVE VISIT - MA Rooming Guide    Rooming Activities:    Vitals (Select all that apply):  _0   Height (required for Welcome to [employer] and Medicare First Annual Wellness; ideal for all visits)   _1   Weight (or waist circumference) (required for all Medicare Preventive Visits)  _2   Blood pressure (required for all Medicare Preventive Visits)  _3   Visual acuity (with correction if applicable); document in Hearing/Vision section of Rooming (required for Welcome to Medicare)      Heath Risk Assessment (HRA) (required for all AWV visits) was completed (check one):  _4   as Patient-Entered Questionnaire via eCare (visible in Synopsis)  _5   on paper HRA form, completed by or with the patient or caregiver (reminder: scan form to Media)  _6   in HRA template documentation, completed by staff or provider with patient or caregiver at visit (for use when HRA was not completed prior to rooming)     Social History review & update (required) was completed (check all that apply):  _7   in Patient-Entered History via eCare   _8   on paper HRA and entered or updated by MA in Social History section  _9   in direct review of Social History section with patient or caretaker at visit     Depression screening (required) (check all that apply):  _10   PHQ-2 responses entered into Screening section  _11    PHQ-9 given and responses entered        Vaccine Screening Questions    Interpreter: No    1. Are you allergic to Latex? NO    2.  Have you had a serious reaction or an allergic reaction to a vaccine?  NO    3.  Currently have a moderate or severe illness, including fever?  NO    4.  Ever had a seizure or any neurological problem associated with a vaccine? (DTaP/TDaP/DTP pertinent) NO    5.  Is patient receiving any live vaccinations today? (Varicella-Chickenpox, MMR-Measles/Mumps/Rubella, Zoster-Shingles, Flumist, Yellow Fever) NOTE: oral rotavirus is exempt  NO    If YES to any of the questions above - Do NOT give vaccine.   Consult with RN or provider in clinic.  (#5 can be YES if all Live vaccine questions are answered NO)    If NO to all questions above - Patient may receive vaccine.    6.  Do you need to receive the Flu vaccine today? YES - Additional Flu Questions  Flu Vaccine Screening Questions:    Ever had a serious allergic reaction to eggs?  NO    Ever had Guillain-Barre syndrome associated with a vaccine? NO    Less than 6 months old? NO    If YES to any of the Flu questions above - NO Flu Vaccine to be given.  Patient may consult provider as needed.    If NO to all questions above - Patient may receive Flu Shot (IM)    Is the patient requesting Flumist? NO    If between 6 months and 21 years of age, was flu vaccine received last year?  N/A  If NO to above question:  . Children who are receiving influenza vaccine for the first time - administer 2 doses of the current influenza vaccine (separated by at least 4 weeks).          All patients are encouraged to wait 15 minutes before leaving after receiving any vaccine.    VIS given 10/29/2018 by Lesle Reek, CMA.

## 2018-11-03 ENCOUNTER — Other Ambulatory Visit: Payer: Self-pay

## 2018-11-04 ENCOUNTER — Other Ambulatory Visit (INDEPENDENT_AMBULATORY_CARE_PROVIDER_SITE_OTHER): Payer: Medicare HMO

## 2018-11-04 ENCOUNTER — Encounter (INDEPENDENT_AMBULATORY_CARE_PROVIDER_SITE_OTHER): Payer: Self-pay | Admitting: Family Medicine

## 2018-11-04 ENCOUNTER — Other Ambulatory Visit (INDEPENDENT_AMBULATORY_CARE_PROVIDER_SITE_OTHER): Payer: Self-pay | Admitting: Family Medicine

## 2018-11-04 ENCOUNTER — Ambulatory Visit: Payer: Medicare HMO | Attending: Family Medicine

## 2018-11-04 DIAGNOSIS — E119 Type 2 diabetes mellitus without complications: Secondary | ICD-10-CM

## 2018-11-04 DIAGNOSIS — Z125 Encounter for screening for malignant neoplasm of prostate: Secondary | ICD-10-CM

## 2018-11-04 DIAGNOSIS — Z136 Encounter for screening for cardiovascular disorders: Secondary | ICD-10-CM

## 2018-11-04 DIAGNOSIS — Z139 Encounter for screening, unspecified: Secondary | ICD-10-CM

## 2018-11-04 DIAGNOSIS — R7309 Other abnormal glucose: Secondary | ICD-10-CM

## 2018-11-04 LAB — COMPREHENSIVE METABOLIC PANEL
ALT (GPT): 21 U/L (ref 10–48)
AST (GOT): 16 U/L (ref 9–38)
Albumin: 4.6 g/dL (ref 3.5–5.2)
Alkaline Phosphatase (Total): 53 U/L (ref 36–161)
Anion Gap: 9 (ref 4–12)
Bilirubin (Total): 1.3 mg/dL (ref 0.2–1.3)
Calcium: 9.4 mg/dL (ref 8.9–10.2)
Carbon Dioxide, Total: 29 meq/L (ref 22–32)
Chloride: 100 meq/L (ref 98–108)
Creatinine: 0.8 mg/dL (ref 0.51–1.18)
Glucose: 159 mg/dL — ABNORMAL HIGH (ref 62–125)
Potassium: 4.9 meq/L (ref 3.6–5.2)
Protein (Total): 6.9 g/dL (ref 6.0–8.2)
Sodium: 138 meq/L (ref 135–145)
Urea Nitrogen: 18 mg/dL (ref 8–21)
eGFR by CKD-EPI: >60 mL/min/{1.73_m2} (ref >59)

## 2018-11-04 LAB — CBC, DIFF
% Basophils: 1 %
% Eosinophils: 1 %
% Immature Granulocytes: 0 %
% Lymphocytes: 22 %
% Monocytes: 8 %
% Neutrophils: 68 %
% Nucleated RBC: 0 %
Absolute Eosinophil Count: 0.07 10*3/uL (ref 0.00–0.50)
Absolute Lymphocyte Count: 1.31 10*3/uL (ref 1.00–4.80)
Basophils: 0.04 10*3/uL (ref 0.00–0.20)
Hematocrit: 49 % (ref 38–50)
Hemoglobin: 16 g/dL (ref 13.0–18.0)
Immature Granulocytes: 0.02 10*3/uL (ref 0.00–0.05)
MCH: 29.5 pg (ref 27.3–33.6)
MCHC: 32.5 g/dL (ref 32.2–36.5)
MCV: 91 fL (ref 81–98)
Monocytes: 0.49 10*3/uL (ref 0.00–0.80)
Neutrophils: 4.08 10*3/uL (ref 1.80–7.00)
Nucleated RBC: 0 10*3/uL
Platelet Count: 298 10*3/uL (ref 150–400)
RBC: 5.43 10*6/uL (ref 4.40–5.60)
RDW-CV: 13.5 % (ref 11.6–14.4)
WBC: 6.01 10*3/uL (ref 4.3–10.0)

## 2018-11-04 LAB — PR URINE ALBUMIN SEMIQUANTITATIVE
Albumin Micro, URN: 10 mg/L
Albumin/Creatinine, URN: <30 mg/g Creat
Creatinine/Unit, Urine: 50 mg/dL

## 2018-11-04 LAB — PSA, DIAGNOSTIC/MONITORING: PSA, Diagnostic/Monitoring: 1.95 ng/mL (ref 0.00–4.00)

## 2018-11-04 LAB — LAB ADD ON ORDER

## 2018-11-04 LAB — THYROID STIMULATING HORMONE: Thyroid Stimulating Hormone: 0.924 u[IU]/mL (ref 0.400–5.000)

## 2018-11-04 LAB — LIPID PANEL
Cholesterol (LDL): 86 mg/dL (ref <130)
Cholesterol/HDL Ratio: 3.4
HDL Cholesterol: 47 mg/dL (ref >39)
Non-HDL Cholesterol: 115 mg/dL (ref 0–159)
Total Cholesterol: 162 mg/dL (ref <200)
Triglyceride: 145 mg/dL (ref <150)

## 2018-11-05 LAB — HEMOGLOBIN A1C, HPLC: Hemoglobin A1C: 6.7 % — ABNORMAL HIGH (ref 4.0–6.0)

## 2018-11-26 ENCOUNTER — Ambulatory Visit: Payer: Self-pay

## 2018-11-26 ENCOUNTER — Telehealth (INDEPENDENT_AMBULATORY_CARE_PROVIDER_SITE_OTHER): Payer: Medicare HMO

## 2018-11-26 ENCOUNTER — Encounter (INDEPENDENT_AMBULATORY_CARE_PROVIDER_SITE_OTHER): Payer: Self-pay

## 2018-11-26 DIAGNOSIS — U071 COVID-19: Secondary | ICD-10-CM

## 2018-11-26 NOTE — Telephone Encounter (Signed)
Reason for Disposition  . MILD difficulty breathing (e.g., minimal/no SOB at rest, SOB with walking, pulse < 100) of new onset or worse than normal    Additional Information  . Negative: SEVERE difficulty breathing (e.g., struggling for each breath, speaks in single words, pulse > 120)  . Negative: Choking on something  . Negative: Breathing stopped and hasn't returned  . Negative: Sounds like a life-threatening emergency to the triager  . Negative: Wheezing started suddenly after medicine, an allergic food, or bee sting  . Negative: Fever > 103 F (39.4 C)  . Negative: Wheezing can be heard across the room  . Negative: MODERATE difficulty breathing (e.g., speaks in phrases, SOB even at rest, pulse 100-120) of new onset or worse than normal  . Negative: Extra heart beats OR irregular heart beating   (i.e., "palpitations")  . Negative: Patient sounds very sick or weak to the triager    Protocols used: BREATHING DIFFICULTY-ADULT - [place]

## 2018-11-26 NOTE — Telephone Encounter (Addendum)
Regarding: [facility]: SOB- POSITIVE COVID  ----- Message from Seabron Spates, Coordinator sent at 11/26/2018  8:34 AM PST -----  [facility] PT: SOB- POSITIVE COVID    [facility] Nurse Line: Patient requesting return call on landline @ [phone].  Reports Positive Covid test at home 11/22/2018 by [facility]. Wife also positive. Reports no temp 37.2 C.   Body aches/fatigue last week. Reports "dry cough when I try to fill up my lungs with air since 11/23/2018". Taking Vit C, D and Zinc.  Finding it hard to breath.  Denies chest pain.  Denies irregular heart rate/beats.  911 ruled out.  PMH: DM Type 2.  Asking if hospitalization indicated.  Warm transfer to [place] to schedule telemedicine appt.today.  Message routed to [facility] since [facility] closed.

## 2018-11-26 NOTE — Progress Notes (Signed)
The visit started at 11:50 AM   The visit ended at 12:03 PM     This visit is being conducted over the telephone at the patient's request: Yes  Patient gives verbal consent to proceed and knows there may be a copay/deductible: Yes    Time spent with patient/guardian on this telephone visit: 13 minutes    Given the importance of social distancing and other strategies recommended to reduce the risk of COVID-19 transmission, I am providing medical care to this patient via a telephone visit in place of an in person visit at the request of the patient.    Originally scheduled for telemedicine visit but ultimately changed to phone visit due to technical difficulties.     Name: William Bryant   Age: 68 year old     Chief Complaint   Patient presents with   . Follow-Up      positive COVID test       PCP: Jorje Guild, Hart Robinsons Bunk Foss, MD       Subjective:     William Bryant presents today for the following:    ~1 week ago began experiencing body aches, chills, dry cough.   Reports evaluation by [facility] 11/9 and had positive COVID-19 test, thinks oxygen saturation was ok on their evaluation.   2 days ago developed shortness of breath, occurs with exertion and at rest.   Becomes short of breath when talking.   Obtained pulse oximeter today which reads 88%.     See end of note for problem list, medications, allergies and history.     Review of Systems:  See HPI.       Objective:   PHYSICAL EXAM  Vitals: SpO2 (!) 88% Comment: pt reported   Pulmonary/Chest: difficulty speaking in full sentences   Neurological: Alert and oriented to person, place, and time.       Assessment and Plan:     1. COVID-19 virus infection  Confirmed COVID-19 infection.   He is at increased risk due to age and diabetes.   Home oxygen saturation low at 88% and having difficulty speaking in full sentences.   Discussed need for immediate evaluation in emergency department and likely admission.   He doesn't feel able to  safely drive himself to the emergency department and there is no one else who can safely drive him.   Recommend that he call 911 which he is agreeable with.     Follow-up: Return for follow up after ER/hospital.     There are no Patient Instructions on file for this visit.     __________________________________________    Patient Active Problem List    Diagnosis Date Noted   . Routine general medical examination at a health care facility [Z00.00] 10/29/2018   . Dystrophic nail [L60.3] 10/29/2018   . Arthralgia [M25.50] 10/29/2018   . Floaters, bilateral [N98.921] 02/18/2018   . Needs flu shot [Z23] 10/29/2017   . Need for pneumococcal vaccination [Z23] 10/29/2017   . Chronic right shoulder pain [M25.511, G89.29] 10/29/2017   . Floaters, right [H43.391] 10/29/2017   . Medicare annual wellness visit, subsequent [Z00.00] 08/28/2017   . Abdominal pain, LLQ [R10.32] 08/28/2017   . Acute left-sided back pain [M54.9] 08/28/2017   . Muscle weakness (generalised) [M62.81] 08/28/2017   . Encounter for abdominal aortic aneurysm (AAA) screening [Z13.6] 06/29/2017   . BMI 31.0-31.9,adult [Z68.31] 06/29/2017   . Urinary retention [R33.9] 01/16/2017   . Impingement syndrome of left shoulder [  M75.42] 11/19/2016   . Stiffness of left shoulder joint [M25.612] 11/19/2016   . Tear of left glenoid labrum [S43.432A] 11/19/2016   . Partial tear of left rotator cuff [M75.112] 11/17/2016   . DDD (degenerative disc disease), lumbar [M51.36] 11/17/2016   . Left hip pain [M25.552] 09/04/2016   . SI (sacroiliac) joint inflammation (HCC) [M46.1] 09/04/2016   . Acute midline low back pain [M54.5] 09/04/2016   . Disorder of left rotator cuff [M67.912] 07/24/2016   . Thoracic segment dysfunction [M99.02] 05/16/2016   . Peyronie disease [N48.6] 05/05/2016   . Urinary frequency [R35.0] 03/31/2016   . Essential hypertension [I10] 03/05/2016   . 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 Right cervical radiculopathy [M54.12] 02/17/2014   . Hoarseness [R49.0] 02/17/2014   . Family history of early CAD--mother 63 [Z82.49] 09/21/2013   . Type 2 diabetes mellitus without complication ([place]) [N05.3] 07/20/2013   . Diverticulosis of large intestine with hemorrhage [K57.31] 07/20/2013   . Mixed hyperlipidemia [E78.2] 07/20/2013   . 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 Ascorbic Acid (VITA-C OR) Take by mouth.     . Aspirin 81 MG Oral Chew Tab Take 81 mg by mouth every day.     . benazepril 5 MG tablet Take 1 tablet by mouth once daily 90 tablet 0   . Cholecalciferol (D 2000) 2000 UNITS Oral Tab Take 1 Tab by mouth every day.     . cyclobenzaprine 5 MG Oral Tablet Take 1 tablet (5 mg) by mouth at bedtime as needed for muscle spasms. 15 tablet 0   . glimepiride 4 MG tablet Take 1 tablet (4 mg) by mouth daily. 90 tablet 3   . metFORMIN 850 MG tablet TAKE 1 TABLET BY MOUTH THREE TIMES DAILY WITH MEALS 90 tablet 3   . pravastatin 20 MG tablet Take 1 tablet by mouth once daily 90 tablet 0   . tamsulosin 0.4 MG capsule Take 1 capsule (0.4 mg) by mouth daily. 90 capsule 3   . Zinc 40 MG tablet Take by mouth.       No facility-administered medications prior to visit.        Review of patient's allergies indicates:  No Known Allergies    Past Surgical History:   Procedure Laterality Date   . knee replacement x2         Family History     Problem (# of Occurrences) Relation (Name,Age of Onset)    Breast Cancer (1) Sister    Cancer (1) Father: patient believes colon cancer    Deep Vein Thrombosis (1) Father    Heart Disease (1) Mother    Myocardial Infarction (1) Other: grandfather     Prostate Cancer (1) Father          Social History     Socioeconomic History   . Marital status: Married     Spouse name: Not on file   . Number of children: Not on file   . Years of education:  Not on file   . Highest education level: Not on file   Occupational History   . Not on file   Social Needs   . Financial resource strain: Not on file   . Food insecurity     Worry: Not on  file     Inability: Not on file   . Transportation needs     Medical: Not on file     Non-medical: Not on file   Tobacco Use   . Smoking status: Former Smoker     Years: 22.00     Types: Cigarettes     Quit date: 02/1999     Years since quitting: 19.7   . Smokeless tobacco: Never Used   Substance and Sexual Activity   . Alcohol use: Yes     Comment: 1-3 drinks per week   . Drug use: No   . Sexual activity: Not on file   Lifestyle   . Physical activity     Days per week: Not on file     Minutes per session: Not on file   . Stress: Not on file   Relationships   . Social Wellsite geologistconnections     Talks on phone: Not on file     Gets together: Not on file     Attends religious service: Not on file     Active member of club or organization: Not on file     Attends meetings of clubs or organizations: Not on file     Relationship status: Not on file   . Intimate partner violence     Fear of current or ex partner: Not on file     Emotionally abused: Not on file     Physically abused: Not on file     Forced sexual activity: Not on file   Other Topics Concern   . Not on file   Social History Narrative    Married with one son. Medical and legal interpreter, self employed. Rare ETOH. Former smoker. No social drug use. Rare exercise.         Colonoscopy performed in June 2015 with Dr. Jaynie CollinsLim, cleared for five years         Ophthalmology visit- referral to [facility] on 09/20/14        TDAP- 11/2012    Zoster-advised 11/15/15    Influenza-11/15/15        Hepatitis C and HIV screening-09/20/14        Diabetic Eye Exam-done on 10/09/14 by Dr. Dyane DustmanBence, bilateral nuclear cataracts, no retinopathy

## 2018-11-29 ENCOUNTER — Telehealth (INDEPENDENT_AMBULATORY_CARE_PROVIDER_SITE_OTHER): Payer: Self-pay | Admitting: Family Medicine

## 2018-11-29 DIAGNOSIS — R0602 Shortness of breath: Secondary | ICD-10-CM

## 2018-11-29 DIAGNOSIS — U071 COVID-19: Secondary | ICD-10-CM

## 2018-11-29 MED ORDER — ALBUTEROL SULFATE HFA 108 (90 BASE) MCG/ACT IN AERS
2.0000 | INHALATION_SPRAY | Freq: Four times a day (QID) | RESPIRATORY_TRACT | 1 refills | Status: DC | PRN
Start: 2018-11-29 — End: 2019-02-04

## 2018-11-29 NOTE — Telephone Encounter (Signed)
X-ray ordered  Prescription for albuterol faxed to the pharmacy    Patient needs to contact radiology to see if they are willing to do the x-ray  Otherwise he needs to go to the emergency room himself    Please ask how he is doing    Dr. Wilhemena Durie

## 2018-11-29 NOTE — Telephone Encounter (Signed)
Called and relayed message below to patient, pt understood and stated he would call radiology

## 2018-11-29 NOTE — Telephone Encounter (Signed)
Caller: Mitzi Hansen  Relationship to patient: self  Preferred Phone #: [phone]  OK to leave detailed voicemail msg: NO  Subject:    Message:      The patient is calling asking for guidance on what to do since he has been tested positive for covid,  He has been in isolation for 10 days and he states that he has SOB (primarily resolved)  Dry cough,  Fatigue, and he states that before he can't even finish a sentence and he gets winded.  He reports that he called an ambulance but they did not bring him to the hospital because he has covid.  He wants an XRAY done as well please call the patient thank you

## 2018-12-01 ENCOUNTER — Telehealth (INDEPENDENT_AMBULATORY_CARE_PROVIDER_SITE_OTHER): Payer: Self-pay | Admitting: Family Medicine

## 2018-12-01 NOTE — Telephone Encounter (Signed)
Caller: Anderw  Relationship to patient: self  Preferred Phone #: [phone]  OK to leave detailed voicemail msg: NO  Subject:  covid questions   Message:      The patient is calling a little "upset" because he has been calling and wanting to speak directly to his pcp.  I told him that generally his provider will call him back when she is in clinic and during times in between patients or right after her clinic.  He said " no one cares about [place] god damn"  He wants direction on what to do he has been self isolating since 11/09/200.  Sending to clinical team.  I also told him that his provider is not here on Wednesdays.

## 2018-12-01 NOTE — Telephone Encounter (Addendum)
Needs telemed or a phone visit  ecare message sent  Please schedule    Dr. Wilhemena Durie

## 2018-12-02 ENCOUNTER — Encounter (INDEPENDENT_AMBULATORY_CARE_PROVIDER_SITE_OTHER): Payer: Self-pay

## 2018-12-02 ENCOUNTER — Telehealth (INDEPENDENT_AMBULATORY_CARE_PROVIDER_SITE_OTHER): Payer: Self-pay | Admitting: Family Medicine

## 2018-12-02 DIAGNOSIS — R059 Cough, unspecified: Secondary | ICD-10-CM

## 2018-12-02 MED ORDER — HYDROCODONE-ACETAMINOPHEN 7.5-325 MG/15ML OR SOLN
15.0000 mL | Freq: Four times a day (QID) | ORAL | 0 refills | Status: DC | PRN
Start: 2018-12-02 — End: 2019-02-04

## 2018-12-02 NOTE — Telephone Encounter (Signed)
RETURN CALL: Voicemail - Detailed Message      SUBJECT:  Appointment Request     REASON FOR VISIT: Telemedicine, 11/22/18 tested positive for Covid-19   PREFERRED DATE/TIME: please contact patient   ADDITIONAL INFORMATION: Per DT it states to send TE. Patient was called by office to schedule appointment however DT not allowing me to schedule Also patiet has a cough and needs some medication. .Thank you.

## 2018-12-02 NOTE — Telephone Encounter (Signed)
Left voice mail and ecare message for patient to call back and schedule telemedicine appointment

## 2018-12-02 NOTE — Telephone Encounter (Signed)
Prescription faxed to pharmacy    Dr. Wilhemena Durie

## 2018-12-03 ENCOUNTER — Telehealth (INDEPENDENT_AMBULATORY_CARE_PROVIDER_SITE_OTHER): Payer: Medicare HMO | Admitting: Family Medicine

## 2018-12-03 DIAGNOSIS — R0602 Shortness of breath: Secondary | ICD-10-CM

## 2018-12-03 DIAGNOSIS — U071 COVID-19: Secondary | ICD-10-CM

## 2018-12-03 DIAGNOSIS — R059 Cough, unspecified: Secondary | ICD-10-CM

## 2018-12-03 DIAGNOSIS — J40 Bronchitis, not specified as acute or chronic: Secondary | ICD-10-CM

## 2018-12-03 DIAGNOSIS — R05 Cough: Secondary | ICD-10-CM

## 2018-12-03 MED ORDER — AZITHROMYCIN 500 MG OR TABS
500.0000 mg | ORAL_TABLET | Freq: Every day | ORAL | 0 refills | Status: DC
Start: 2018-12-03 — End: 2019-02-04

## 2018-12-03 MED ORDER — ALBUTEROL SULFATE HFA 108 (90 BASE) MCG/ACT IN AERS
1.0000 | INHALATION_SPRAY | Freq: Four times a day (QID) | RESPIRATORY_TRACT | 0 refills | Status: DC | PRN
Start: 2018-12-03 — End: 2019-02-04

## 2018-12-03 NOTE — Telephone Encounter (Signed)
MA instructed to put him in schedule today as I have a cancellation    Dr. Wilhemena Durie
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
## 2018-12-03 NOTE — Patient Instructions (Signed)
Your test for the novel coronavirus, also known as COVID-19, was positive. The sample showed that the virus was present.     If your symptoms are generally mild and stable, you are safe to isolate yourself at home. If you develop difficulty breathing, you should contact your doctor.     Treatment of coronavirus does not require an antibiotic or an anti-viral medication. The most important thing you can do is to remain home except to receive medical care. Do not go to work, school, or public areas.     You should remain isolated:   for a minimum of 10 days from symptom onset and    at least 24 hours have passed since your last fever without the use of fever-reducing medications and    other COVID-19 symptoms have improved.    If you never had any symptoms of COVID-19, you should remain isolated for 10 days from your positive test. If you go on to develop symptoms, you should isolate according to the guidelines above.    Once you have tested positive for COVID-19, we do not recommend repeating the test within 90 days. The test can remain positive for many weeks due to fragments of virus that may be detected but are no longer infectious. If you had a prior positive or inconclusive test in the last 90 days, you do not need to isolate again.     Household contacts may be tested for COVID-19 after 48 hours following exposure. Household contacts should quarantine for 14 days even if they have a negative COVID-19 test result. The 14-day quarantine period begins once you are isolated from your household contacts. If you are not able to separate yourself from your household contacts, the 14-day quarantine period begins when you have met the criteria to break isolation.    Please remember to:   Wash your hands often with soap and water for at least 20 seconds. Alternatively, you may use hand sanitizer with at least 60% alcohol content.   You should wear a mask over your nose and mouth if you must be around other people or  animals, including pets (even at home). You don't need to wear the mask if you are alone.    Try to stay at least 6 feet away from other people. This will help protect the people around you.   Clean all "high-touch" surfaces every day such as doorknobs and cellphones using an EPA approved cleaner (more information can be found on the CDC's website).   Continually monitor your symptoms and contact your doctor if you need medical attention.    Visit the [facility] COVID-19 Follow-up Instructions Page for additional information at [email or web address]

## 2018-12-13 ENCOUNTER — Telehealth (INDEPENDENT_AMBULATORY_CARE_PROVIDER_SITE_OTHER): Payer: Self-pay | Admitting: Family Medicine

## 2018-12-13 NOTE — Telephone Encounter (Signed)
follow up

## 2018-12-24 ENCOUNTER — Other Ambulatory Visit (INDEPENDENT_AMBULATORY_CARE_PROVIDER_SITE_OTHER): Payer: Self-pay | Admitting: Family Medicine

## 2018-12-24 DIAGNOSIS — E782 Mixed hyperlipidemia: Secondary | ICD-10-CM

## 2018-12-27 MED ORDER — PRAVASTATIN SODIUM 20 MG OR TABS
20.0000 mg | ORAL_TABLET | Freq: Every day | ORAL | 3 refills | Status: AC
Start: 2018-12-27 — End: ?

## 2018-12-29 ENCOUNTER — Telehealth (INDEPENDENT_AMBULATORY_CARE_PROVIDER_SITE_OTHER): Payer: Self-pay | Admitting: Family Medicine

## 2018-12-29 ENCOUNTER — Ambulatory Visit (INDEPENDENT_AMBULATORY_CARE_PROVIDER_SITE_OTHER): Payer: Medicare HMO

## 2018-12-29 DIAGNOSIS — R059 Cough, unspecified: Secondary | ICD-10-CM

## 2018-12-29 NOTE — Telephone Encounter (Addendum)
Pt advised that PCP recommendation would be to wait at least 2 additional weeks.  He is fine rescheduling, this is now set for 01/18/19.  He does wonder if PCP will authorize a CXR for him to determine if any damage has been done to lungs, given extended and continued nature of coughing, especially in the evening.      Pending and routing to PCP for review.    Greggory Stallion, LPN

## 2018-12-29 NOTE — Telephone Encounter (Signed)
11/22/18 tested positive, feels fine now.  Occasional cough in the evenings and mornings, no more than 2-3 an hour.  Otherwise asymptomatic.    Fine to get his second Hep B, scheduled today on clinical support.    Greggory Stallion, LPN

## 2018-12-29 NOTE — Telephone Encounter (Signed)
CXR ordered    Dr. Wilhemena Durie

## 2018-12-29 NOTE — Addendum Note (Signed)
Addended by: Wilhemena Durie, Sherree Shankman DE [place] on: 12/29/2018 12:19 PM     Modules accepted: Orders

## 2018-12-29 NOTE — Telephone Encounter (Signed)
Caller: Mitzi Hansen  Relationship to patient: self  Preferred Phone #: [phone]  OK to leave detailed voicemail msg: NO  Subject:    Message:      The patient is asking if he can get hi shot, he was tested for covid and was positive, he is stating that he has not left the house since he tested positive is it okay to schedule him for the shot?  Please advise

## 2018-12-29 NOTE — Addendum Note (Signed)
Addended by: Rhodia Albright on: 12/29/2018 12:16 PM     Modules accepted: Orders

## 2019-01-04 ENCOUNTER — Telehealth (INDEPENDENT_AMBULATORY_CARE_PROVIDER_SITE_OTHER): Payer: Self-pay | Admitting: Family Medicine

## 2019-01-04 DIAGNOSIS — E119 Type 2 diabetes mellitus without complications: Secondary | ICD-10-CM

## 2019-01-04 MED ORDER — METFORMIN HCL 850 MG OR TABS
ORAL_TABLET | ORAL | 3 refills | Status: DC
Start: 2019-01-04 — End: 2019-04-05

## 2019-01-04 NOTE — Addendum Note (Signed)
Addended by: Saunders Glance on: 01/04/2019 05:26 PM     Modules accepted: Orders

## 2019-01-04 NOTE — Addendum Note (Signed)
Addended by: Wilhemena Durie, Eliyah Bazzi [place] on: 01/04/2019 06:02 PM     Modules accepted: Orders

## 2019-01-04 NOTE — Telephone Encounter (Signed)
Pended for review

## 2019-01-04 NOTE — Telephone Encounter (Signed)
Patient calling about metformin. Requesting that PCP switch him back to 90 day refills.

## 2019-01-18 ENCOUNTER — Ambulatory Visit (INDEPENDENT_AMBULATORY_CARE_PROVIDER_SITE_OTHER): Payer: Medicare HMO

## 2019-01-18 DIAGNOSIS — Z23 Encounter for immunization: Secondary | ICD-10-CM

## 2019-01-18 NOTE — Progress Notes (Signed)
Vaccine Screening Questions    Administration of Hep B     Interpreter: No    1. Are you allergic to Latex? NO    2.  Have you had a serious reaction or an allergic reaction to a vaccine?  NO    3.  Currently have a moderate or severe illness, including fever?  NO    4.  Ever had a seizure or any neurological problem associated with a vaccine? (DTaP/TDaP/DTP pertinent) NO    5.  Is patient receiving any live vaccinations today? (Varicella-Chickenpox, MMR-Measles/Mumps/Rubella, Zoster-Shingles, Flumist, Yellow Fever) NOTE: oral rotavirus is exempt  NO    If YES to any of the questions above - Do NOT give vaccine.  Consult with RN or provider in clinic.  (#5 can be YES if all Live vaccine questions are answered NO)    If NO to all questions above - Patient may receive vaccine.    6.  Do you need to receive the Flu vaccine today? NO      All patients are encouraged to wait 15 minutes before leaving after receiving any vaccine.    VIS given today by:   Saunders Glance, CMA

## 2019-01-31 ENCOUNTER — Other Ambulatory Visit (INDEPENDENT_AMBULATORY_CARE_PROVIDER_SITE_OTHER): Payer: Self-pay | Admitting: Family Medicine

## 2019-01-31 DIAGNOSIS — I10 Essential (primary) hypertension: Secondary | ICD-10-CM

## 2019-02-02 MED ORDER — BENAZEPRIL HCL 5 MG OR TABS
ORAL_TABLET | ORAL | 2 refills | Status: AC
Start: 2019-02-02 — End: ?

## 2019-02-04 ENCOUNTER — Ambulatory Visit (INDEPENDENT_AMBULATORY_CARE_PROVIDER_SITE_OTHER): Payer: Medicare HMO | Admitting: Urology

## 2019-02-04 ENCOUNTER — Encounter (INDEPENDENT_AMBULATORY_CARE_PROVIDER_SITE_OTHER): Payer: Self-pay | Admitting: Urology

## 2019-02-04 VITALS — Temp 97.6°F | Ht 71.0 in | Wt 223.0 lb

## 2019-02-04 DIAGNOSIS — N138 Other obstructive and reflux uropathy: Secondary | ICD-10-CM

## 2019-02-04 DIAGNOSIS — N401 Enlarged prostate with lower urinary tract symptoms: Secondary | ICD-10-CM

## 2019-02-04 DIAGNOSIS — R350 Frequency of micturition: Secondary | ICD-10-CM

## 2019-02-04 MED ORDER — TAMSULOSIN HCL 0.4 MG OR CAPS
0.4000 mg | ORAL_CAPSULE | Freq: Every day | ORAL | 3 refills | Status: DC
Start: 2019-02-04 — End: 2019-03-07

## 2019-02-04 NOTE — Progress Notes (Signed)
UROLOGY CLINIC NOTE    Reason for Visit:   Chief Complaint   Patient presents with   . Annual Exam     BPH      History of Present Illness: William Bryant is a 69 year old male who returns to me today.  He is here to follow-up on his BPH.  His PSA is stable and low at 1.9.  He is on tamsulosin.  No other new problems.        A 14 system review of systems was completed by the patient and was reviewed he did develop Covid and November.  He feels well now.     Active Meds:   Outpatient Medications Marked as Taking for the 02/04/19 encounter (Office Visit) with Gwenlyn Fudge, MD   Medication Sig Dispense Refill   . Ascorbic Acid (VITA-C OR) Take by mouth.     . Aspirin 81 MG Oral Chew Tab Take 81 mg by mouth every day.     . benazepril 5 MG tablet Take 1 tablet by mouth once daily 90 tablet 2   . Cholecalciferol (D 2000) 2000 UNITS Oral Tab Take 1 Tab by mouth every day.     . cyclobenzaprine 5 MG Oral Tablet Take 1 tablet (5 mg) by mouth at bedtime as needed for muscle spasms. 15 tablet 0   . glimepiride 4 MG tablet Take 1 tablet (4 mg) by mouth daily. 90 tablet 3   . metFORMIN 850 MG tablet TAKE 1 TABLET BY MOUTH THREE TIMES DAILY WITH MEALS 90 tablet 3   . pravastatin 20 MG tablet Take 1 tablet (20 mg) by mouth daily. 90 tablet 3   . tamsulosin 0.4 MG capsule Take 1 capsule (0.4 mg) by mouth daily. 90 capsule 3     Allergies: Review of patient's allergies indicates:  No Known Allergies    OBJECTIVE:  Physical Exam: Temp 97.6 F (36.4 C) (Temporal)   Ht 5\' 11"  (1.803 m)   Wt (!) 223 lb (101.2 kg)   BMI 31.10 kg/m   Constitutional: WDWN, pleasant and appropriate affect and no acute distress  Abdomen: No abdominal masses or tenderness, no hepatospleenomegaly, no CVA tenderness and no hernias noted  GU Exam: Uncircumcised penis with no lesions noticed and normal glands. Urethral meatus normal in location and size. No urethral discharge   Testes descended bilaterally. No scrotal rash or lesions noticed. Epidimymis normal to palpation. Perineum normal to visual inspection, no erythema or irritation, sphincter with good tone, rectum with no hemorrhoids, fissures or masses, prostate smooth, symmetric, anodular, approximately 40 grams in size     ASSESSMENT/PLAN:    He seems to be doing quite well on current therapy.  He will continue the same and follow-up annually.  All questions answered.

## 2019-02-07 ENCOUNTER — Encounter (INDEPENDENT_AMBULATORY_CARE_PROVIDER_SITE_OTHER): Payer: Medicare HMO | Admitting: Urology

## 2019-03-05 ENCOUNTER — Other Ambulatory Visit (INDEPENDENT_AMBULATORY_CARE_PROVIDER_SITE_OTHER): Payer: Self-pay | Admitting: Urology

## 2019-03-05 DIAGNOSIS — R350 Frequency of micturition: Secondary | ICD-10-CM

## 2019-03-07 MED ORDER — TAMSULOSIN HCL 0.4 MG OR CAPS
ORAL_CAPSULE | ORAL | 3 refills | Status: AC
Start: 2019-03-07 — End: ?

## 2019-03-07 NOTE — Telephone Encounter (Signed)
03/07/2019      Refill Request    Last visit: 02/04/2019  Next visit: Visit date not found  Last refill: 02/04/2019  Last Prescribed by: Erline Hau, MD  Labs: None        Outpatient Medications Prior to Visit   Medication Sig Dispense Refill   . Ascorbic Acid (VITA-C OR) Take by mouth.     . Aspirin 81 MG Oral Chew Tab Take 81 mg by mouth every day.     . benazepril 5 MG tablet Take 1 tablet by mouth once daily 90 tablet 2   . Cholecalciferol (D 2000) 2000 UNITS Oral Tab Take 1 Tab by mouth every day.     . cyclobenzaprine 5 MG Oral Tablet Take 1 tablet (5 mg) by mouth at bedtime as needed for muscle spasms. 15 tablet 0   . glimepiride 4 MG tablet Take 1 tablet (4 mg) by mouth daily. 90 tablet 3   . metFORMIN 850 MG tablet TAKE 1 TABLET BY MOUTH THREE TIMES DAILY WITH MEALS 90 tablet 3   . pravastatin 20 MG tablet Take 1 tablet (20 mg) by mouth daily. 90 tablet 3   . tamsulosin 0.4 MG capsule Take 1 capsule (0.4 mg) by mouth daily. 90 capsule 3     No facility-administered medications prior to visit.        Inge Rise, CMA

## 2019-04-05 ENCOUNTER — Other Ambulatory Visit (INDEPENDENT_AMBULATORY_CARE_PROVIDER_SITE_OTHER): Payer: Self-pay | Admitting: Family Medicine

## 2019-04-05 DIAGNOSIS — E119 Type 2 diabetes mellitus without complications: Secondary | ICD-10-CM

## 2019-04-06 MED ORDER — METFORMIN HCL 850 MG OR TABS
ORAL_TABLET | ORAL | 5 refills | Status: AC
Start: 2019-04-06 — End: ?

## 2019-05-02 ENCOUNTER — Telehealth (INDEPENDENT_AMBULATORY_CARE_PROVIDER_SITE_OTHER): Payer: Self-pay

## 2019-05-02 NOTE — Telephone Encounter (Signed)
Scheduled for 05/05/2019 at 1000AM

## 2019-05-02 NOTE — Telephone Encounter (Signed)
RETURN CALL: Voicemail - Detailed Message      SUBJECT:  Appointment Request     REASON FOR VISIT: 3rd series of Hep B vaccine  PREFERRED DATE/TIME: please call to assist  ADDITIONAL INFORMATION: per [facility]

## 2019-05-05 ENCOUNTER — Ambulatory Visit (INDEPENDENT_AMBULATORY_CARE_PROVIDER_SITE_OTHER): Payer: Medicare HMO

## 2019-05-05 DIAGNOSIS — Z23 Encounter for immunization: Secondary | ICD-10-CM

## 2019-05-05 MED ORDER — HEPATITIS B VAC RECOMBINANT 20 MCG/ML IJ SUSP
20.0000 ug | Freq: Once | INTRAMUSCULAR | Status: AC
Start: 2019-05-05 — End: 2019-05-05
  Administered 2019-05-05: 20 ug via INTRAMUSCULAR

## 2019-05-05 NOTE — Progress Notes (Signed)
Gave 3rd Hep B shot

## 2019-06-28 ENCOUNTER — Ambulatory Visit (INDEPENDENT_AMBULATORY_CARE_PROVIDER_SITE_OTHER): Payer: Medicare HMO | Admitting: Family Medicine

## 2019-06-28 ENCOUNTER — Other Ambulatory Visit (INDEPENDENT_AMBULATORY_CARE_PROVIDER_SITE_OTHER): Payer: Self-pay | Admitting: Family Medicine

## 2019-06-28 ENCOUNTER — Ambulatory Visit: Payer: Medicare HMO | Attending: Family Medicine

## 2019-06-28 VITALS — BP 142/70 | HR 63 | Temp 97.7°F | Resp 14 | Ht 71.0 in | Wt 218.0 lb

## 2019-06-28 DIAGNOSIS — E119 Type 2 diabetes mellitus without complications: Secondary | ICD-10-CM

## 2019-06-28 DIAGNOSIS — Z9189 Other specified personal risk factors, not elsewhere classified: Secondary | ICD-10-CM

## 2019-06-28 LAB — THYROID STIMULATING HORMONE: Thyroid Stimulating Hormone: 0.687 u[IU]/mL (ref 0.400–5.000)

## 2019-06-28 LAB — CBC, DIFF
% Basophils: 0 %
% Eosinophils: 2 %
% Immature Granulocytes: 0 %
% Lymphocytes: 15 %
% Monocytes: 8 %
% Neutrophils: 75 %
% Nucleated RBC: 0 %
Absolute Eosinophil Count: 0.15 10*3/uL (ref 0.00–0.50)
Absolute Lymphocyte Count: 1.13 10*3/uL (ref 1.00–4.80)
Basophils: 0.03 10*3/uL (ref 0.00–0.20)
Hematocrit: 47 % (ref 38–50)
Hemoglobin: 15.6 g/dL (ref 13.0–18.0)
Immature Granulocytes: 0.02 10*3/uL (ref 0.00–0.05)
MCH: 29.7 pg (ref 27.3–33.6)
MCHC: 33.3 g/dL (ref 32.2–36.5)
MCV: 89 fL (ref 81–98)
Monocytes: 0.59 10*3/uL (ref 0.00–0.80)
Neutrophils: 5.43 10*3/uL (ref 1.80–7.00)
Nucleated RBC: 0 10*3/uL
Platelet Count: 286 10*3/uL (ref 150–400)
RBC: 5.26 10*6/uL (ref 4.40–5.60)
RDW-CV: 13.6 % (ref 11.6–14.4)
WBC: 7.35 10*3/uL (ref 4.3–10.0)

## 2019-06-28 LAB — LIPID PANEL
Cholesterol (LDL): 87 mg/dL (ref <130)
Cholesterol/HDL Ratio: 3.1
HDL Cholesterol: 53 mg/dL (ref >39)
Non-HDL Cholesterol: 111 mg/dL (ref 0–159)
Total Cholesterol: 164 mg/dL (ref <200)
Triglyceride: 121 mg/dL (ref <150)

## 2019-06-28 LAB — COMPREHENSIVE METABOLIC PANEL
ALT (GPT): 21 U/L (ref 10–48)
AST (GOT): 18 U/L (ref 9–38)
Albumin: 4.7 g/dL (ref 3.5–5.2)
Alkaline Phosphatase (Total): 55 U/L (ref 36–161)
Anion Gap: 9 (ref 4–12)
Bilirubin (Total): 1.5 mg/dL — ABNORMAL HIGH (ref 0.2–1.3)
Calcium: 9.6 mg/dL (ref 8.9–10.2)
Carbon Dioxide, Total: 28 meq/L (ref 22–32)
Chloride: 99 meq/L (ref 98–108)
Creatinine: 0.7 mg/dL (ref 0.51–1.18)
Glucose: 153 mg/dL — ABNORMAL HIGH (ref 62–125)
Potassium: 4.4 meq/L (ref 3.6–5.2)
Protein (Total): 7 g/dL (ref 6.0–8.2)
Sodium: 136 meq/L (ref 135–145)
Urea Nitrogen: 23 mg/dL — ABNORMAL HIGH (ref 8–21)
eGFR by CKD-EPI: >60 mL/min/{1.73_m2} (ref >59)

## 2019-06-28 LAB — ALBUMIN/CREATININE RATIO, RANDOM URINE
Albumin (Micro), URN: <0.7 mg/dL
Albumin/Creatinine Ratio, URN: <12 mg/g{creat} (ref <30)
Creatinine/Unit, URN: 57 mg/dL

## 2019-06-28 LAB — A1C RAPID, ONSITE: HEMOGLOBIN A1C, ONSITE: 6.8

## 2019-06-28 MED ORDER — CANAGLIFLOZIN 100 MG OR TABS
100.0000 mg | ORAL_TABLET | Freq: Every day | ORAL | 0 refills | Status: AC
Start: 2019-06-28 — End: ?

## 2019-06-28 NOTE — Patient Instructions (Addendum)
The 10-year ASCVD risk score Denman George DC Montez Hageman., et al., 2013) is: 32.4%    Values used to calculate the score:      Age: 69 years      Sex: Male      Is Non-Hispanic African American: No      Diabetic: Yes      Tobacco smoker: No      Systolic Blood Pressure: 142 mmHg      Is BP treated: Yes      HDL Cholesterol: 47 mg/dL      Total Cholesterol: 162 mg/dL    Patient Education     Diabetes: Activity Tips    Being more active can help you manage your diabetes. The tips on this sheet can help you get the most from your exercise. They can also help you stay safe.   Staying active   Its important for adults to spend less time sitting and being inactive. This is especially true if you have type 2 diabetes. When you are sitting for long periods of time, get up for short sessions of light activity every 30 minutes.   Aim for at least 150 minutes a week of exercise or physical activity. That's about 30 minutes a day 5 to 7 days a week. Dont let more than 2 days go by without being active. Break up your daily activity into 10-minute blocks. Or choose a daily schedule that works for you. Make your goal 30 minutes of daily physical activity.   Benefit from briskness  Brisk activity gets your heart beating faster. This can help make you more fit, lose extra weight, and manage your blood sugar level. Try brisk walking. Or try swimming or bike riding if you have foot or leg problems. You can break up your exercise into chunks throughout the day. Work up to at [employer] minutes of steady, brisk exercise on most days.   Warm up and cool down  Warming up and cooling down reduce your risk for injury. This also helps limit muscle soreness. Do a mild version of your activity for before and after your routine. You can also learn stretches that will help keep your muscles loose. Your healthcare provider may show you good ways to warm up and stretch.   Do the talk-sing test  The talk-sing test is a simple way to tell how hard youre  exercising. If you can talk while exercising, youre in a safe range. If youre out of breath, slow down. If you can carry a tune, its time to pick up the pace. Walk up a hill. Add more resistance on your stationary bike. Or swim faster.   What about eating?  You may be told to plan your exercise for 1 to 2 hours after a meal. In most cases, you dont need to eat while being active. Test your blood sugar before exercising if you take insulin or medicine that can cause low blood sugar. And carry a fast-acting sugar that will raise your blood sugar level quickly. This includes glucose tablets or glucose gel in a tube. Use it if you feel low blood sugar symptoms.   Safety tips  These tips can help you stay safe as you become fit:   Exercise with a friend or carry a cell phone if you have one.   Carry or wear ID such as a necklace or bracelet that says you have diabetes.   Use the correct footwear and safety equipment for your activity.   Drink  water before, during, and after exercise.   Dress for the weather.   Dont exercise in very hot or very cold weather.   Dont exercise if you are sick.   Test your blood sugar before and after you exercise if you are told to do so. Have a small carbohydrate snack if your blood sugar is low before you start exercising.  When to stop exercising and call your healthcare provider  Stop exercising and call your healthcare provider right away if you notice any of the following:    Pain, pressure, tightness, or heaviness in the chest   Pain or heaviness in the neck, shoulders, back, arms, legs, or feet   Unusual shortness of breath   Dizziness or lightheadedness   Unusually rapid or slow pulse   Increased joint or muscle pain   Nausea or vomiting  StayWell last reviewed this educational content on 11/13/2016   2000-2020 [employer] [place]. [address], [place], [place] [address]. All rights reserved. This information is not intended as a substitute for  professional medical care. Always follow your healthcare professional's instructions.           Patient Education     Diet: Diabetes  Food is an important tool that you can use to control diabetes and stay healthy. Eating well-balanced meals in the correct amounts will help you control your blood glucose levels and prevent low blood sugar reactions. It will also help you reduce the health risks of diabetes. There is no one specific diet that is right for everyone with diabetes. But there are general guidelines to follow. A registered dietitian (RD) will create a tailored diet approach thats just right for you. He or she will also help you plan healthy meals and snacks. If you have any questions, call your dietitian for advice.  Guidelines for success  Talk with your healthcare provider before starting a diabetes diet or weight loss program. If you haven't talked with a dietitian yet, ask your provider for a referral. The following guidelines can help you succeed:   Select foods from the 6 food groups below. Your dietitian will help you find food choices within each group. He or she will also show you serving sizes and how many servings you can have at each meal.  ? Grains, beans, and starchy vegetables  ? Vegetables  ? Fruit  ? Milk or yogurt  ? Meat, poultry, fish, or tofu  ? Healthy fats   Check your blood sugar levels as directed by your provider. Take any medicine as prescribed by your provider.   Learn to read food labels and pick the right portion sizes.   Limit carbohydrates at each meal to help manage your diabetes. The carbohydrates you eat become glucose in the blood. Talk with your healthcare provider about how many grams of carbohydrates are recommended for you at each meal. Eat 3 meals a day, at consistent times. Don't skip meals. If you are hungry between meals, eat a small, low-carbohydrate snack.   Talk with your healthcare provider if you drink alcohol. Alcohol can have unpredictable effects  on blood glucose. It's also high in empty calories and can raise a type of blood fat called triglycerides. Drink water or calorie-free diet drinks instead.   Eat less fat to help lower your risk of heart disease. Use nonfat or low-fat dairy products and lean meats. Avoid fried foods. Use cooking oils that are unsaturated, such as olive, canola, or peanut oil.  Don't eat foods with added salt. Salt can contribute to high blood pressure, which can cause heart disease. People with diabetes already have a risk for high blood pressure and heart disease.   Stay at a healthy weight. If you need to lose weight, cut down on your portion sizes. But dont skip meals. Exercise is an important part of any weight management program. Talk with your provider about an exercise program thats right for you.   For more information about the best diet plan for you, talk with an RD. To find an RD in your area, contact:  ? Academy of Nutrition and Dietetics www.eatright.org  ? American Diabetes Association [phone] www.diabetes.org  StayWell last reviewed this educational content on 07/13/2017   2000-2020 [employer]. [address] [place] [address]. All rights reserved. This information is not intended as a substitute for professional medical care. Always follow your healthcare professional's instructions.           Patient Education     Facts About Diabetes  What is diabetes?  When you have diabetes, your body doesn't make enough insulin. Or it can't use the insulin it makes. Insulin is a hormone. It helps sugar enter the cells to be used as energy. Without insulin, too much sugar collects in your blood.   There are 3 types of diabetes. They are type 1, type 2, and gestational diabetes.   What is prediabetes?  Prediabetes often happens before type 2 diabetes. Prediabetes is when blood sugar levels are higher than normal. But not as high as for diabetes. Many people with prediabetes will have type 2  diabetes within 10 years. More than 1 in 3 [place] adults have prediabetes. And most of them don't know the risks they face. Prediabetes also raises the risk for heart disease and stroke.   You can delay type 2 diabetes. Or even prevent it. You can do this by making lifestyle changes. These include losing extra weight if you are overweight. And getting more exercise. If you are overweight, losing 5% to 7% of your weight can help. Aim for at least 150 minutes a week of physical activity. Don't let more than 2 days go by without being active. Ask your healthcare provider for a referral to a lifestyle intervention program. This program will help you get to and stay at a 7% weight loss and increase physical activity.   Experts also advise all adults to spend less time sitting and being inactive. This is even more important if you have type 2 diabetes. If you do sit for a long time, get up for some light activity every 30 minutes.   How does diabetes affect blood sugar?   Your pancreas makes insulin. Insulin is needed for glucose to move into the body's cells for energy. Normally insulin is available for this.   When you have diabetes, your pancreas makes little or no insulin. Or your body's cells dont respond to the insulin thats made. This causes sugar to build up in the blood. But your body's cells need sugar. Without it, they don't have enough fuel to work as they should.   The 3 main types of diabetes all lead to a buildup of blood sugar. This happens because of problems with insulin. But each type has a different cause and treatment:    Type 1 diabetes. Type 1 diabetes is an autoimmune disease. The body's immune system destroys the cells in the pancreas that make insulin. This means  your body makes little or no insulin. People with type 1 diabetes must take insulin every day to live. About 1 in 20 people with diabetes have type 1.   Type 2 diabetes. Type 2 diabetes happens when the body can't make enough insulin.  Or the body can't use it correctly. Type 2 may be controlled with diet, exercise, and weight loss. It can also be controlled with medicine taken by mouth. Or with insulin injections. About 9 in 10 to 19 in 20 people with diabetes have type 2.   Gestational diabetes. This type happens during pregnancy. It affects women who did not have diabetes before they got pregnant. They can't use the insulin their body makes. This type of diabetes often goes away after the baby is born.If it doesn't, it likely was not gestational diabetes. It was more likely type 1 or type 2 diabetes that began during pregnancy.Gestational diabetes may be controlled with diet and exercise, and by watching weight gain. Women with this type may need to take medicines to control blood sugar.They may be at higher risk for type 2 later in life.  Complications of diabetes  Complications of diabetes include:    Eye problems and blindness   Heart disease   Stroke   Nervous system problems   Loss of a limb   Kidney disease   Impotence  Except for gestational diabetes, diabetes is an ongoing (chronic) disease that can't be cured. It affects nearly every part of the body. It can lead to other serious diseases. And it can be life-threatening. You must work with a healthcare provider to manage your diabetes. With the correct care, you can prevent the serious problems of the disease. Or stop them from getting worse.   StayWell last reviewed this educational content on 11/13/2017   2000-2020 [employer], [place]. [address] [place], [place] [address]. All rights reserved. This information is not intended as a substitute for professional medical care. Always follow your healthcare professional's instructions.           Patient Education     Your Diabetes Foot Care Program    Every day you depend on your feet to keep you moving. But when you have diabetes, your feet need special care. Even a small foot problem can become very serious. So  dont take your feet for granted. Work with your diabetes healthcare team. They can help you protect your feet and keep them healthy.  Assessing your feet  An assessment helps your healthcare provider check the condition of your feet. The assessment includes a review of your diabetes history and overall health. It may also include a foot exam, X-rays, or other tests. These can help show problems beneath the skin that you cant see or feel.  Health history  You will be asked about your overall health and any history of foot problems. Youll also discuss your diabetes history, such as if your blood sugar level has changed over time. It also includes questions about feelings of pain, tingling, pins and needles, or numbness. Your healthcare provider will also want to know if you have high blood pressure and heart disease. Or if you smoke.Tell your provider about any past foot infections, medicines (including over-the-counter), vitamins, supplements, or herbs you take.  Foot exam  A foot exam checks the condition of different parts of your foot. First your skin and nails are checked for any signs of infection. Blood flow is checked by feeling for the pulses in  each foot. You may also have tests to study the nerves in the foot. These include using a small wire (filament) to see how sensitive your feet are. Dry skin on your feet may be a sign of damage to nerves which control the moisture on your skin. Toe nail fungal infections may lead to more serious bacterial infections. In certain cases, you will be asked to walk a short distance. This is done to check for bone, joint, and muscle problems.  Diagnostic tests  If needed, your healthcare provider will suggest certain tests to learn more about your feet. These include:   Doppler tests. These measure blood flow in the feet and lower leg.   X-rays. These can show bone or joint problems.   Other imaging tests. These may include an MRI, bone scan, and CT scan. These can  help show bone infections.   Other tests. These may include vascular tests. These tests study the blood flow in your feet and legs. This is done by comparing the blood pressures in your arm and ankle. You may also have nerve studies to learn how sensitive your feet are.  Creating a foot care program  Based on the evaluation, your healthcare provider will create a foot care program for you. This may be as simple as starting a daily self-care routine. And changing the types of shoes you wear. It may also include treating minor foot problems such as a corn or blister. In some cases, surgery will be needed to treat an infection. Or to treat mechanical problems, such as claw toes and hammer toes.  Preventing problems  When you have diabetes, its easier to prevent problems than to treat them later on. So see your healthcare team for regular checkups and foot care. Your healthcare team can also help you learn more about caring for your feet at home. For example, you may be told to not walk barefoot, even in your home. Or you may be told you need special footwear to protect your feet.  Have regular checkups  Foot problems can happen quickly. So follow your healthcare teams schedule for regular checkups. During office visits, take off your shoes and socks as soon as you get in the exam room. Ask your healthcare provider to check your feet for problems. This will make it easier to find and treat small skin issues before they get worse. Regular checkups can also help keep track of the blood flow and feeling in your feet. You may have lack of feeling in your feet (neuropathy). Then you will need checkups more often.  Learn about self-care  The more you know about diabetes and your feet, the easier it will be to prevent problems. Your healthcare team can teach you how to check your feet. And teach you to look for warning signs. They can also give you other foot care tips. Before your office visits, write down any foot care  questions you have. During office visits, ask any questions you have.  StayWell last reviewed this educational content on 11/13/2017   2000-2020 [employer]. [address], [place], [place] [id number]. All rights reserved. This information is not intended as a substitute for professional medical care. Always follow your healthcare professional's instructions.           Patient Education     Canagliflozin oral tablets  Brand Name: Theodis Satonvokana  What is this medicine?  CANAGLIFLOZIN (KAN a gli FLOE zin) helps to treat type 2 diabetes. It helps  to control blood sugar. Treatment is combined with diet and exercise. This drug may also reduce the risk of heart attack, stroke, or death if you have type 2 diabetes and risk factors for heart disease. If you have diabetic kidney disease with a certain amount of protein in the urine, this drug may reduce your risk of end stage kidney disease (ESKD), worsened kidney function, and hospitalization for heart failure.  How should I use this medicine?  Take this medicine by mouth with a glass of water. Follow the directions on the prescription label. Take it before the first meal of the day. Take your dose at the same time each day. Do not take more often than directed. Do not stop taking except on your doctor's advice.  A special MedGuide will be given to you by the pharmacist with each prescription and refill. Be sure to read this information carefully each time.  Talk to your pediatrician regarding the use of this medicine in children. Special care may be needed.  What side effects may I notice from receiving this medicine?  Side effects that you should report to your doctor or health care professional as soon as possible:   allergic reactions like skin rash, itching or hives, swelling of the face, lips, or tongue   breathing problems   chest pain   dizziness   feeling faint or lightheaded, falls   muscle weakness   nausea, vomiting, unusual stomach upset or  pain   new pain or tenderness, change in skin color, sores or ulcers, or infection in legs or feet   penile discharge, itching, or pain in men   signs and symptoms of a genital infection, such as fever; tenderness, redness, or swelling in the genitals or area from the genitals to the back of the rectum   signs and symptoms of low blood sugar such as feeling anxious, confusion, dizziness, increased hunger, unusually weak or tired, sweating, shakiness, cold, irritable, headache, blurred vision, fast heartbeat, loss of consciousness   signs and symptoms of a urinary tract infection, such as fever, chills, a burning feeling when urinating, blood in the urine, back pain   tingling or numbness in the hands, legs, or feet   trouble passing urine or change in the amount of urine, including an urgent need to urinate more often, in larger amounts, or at night   unusual tiredness   vaginal discharge, itching, or odor in women  Side effects that usually do not require medical attention (report to your doctor or health care professional if they continue or are bothersome):   mild increase in urination   thirsty  What may interact with this medicine?  Do not take this medicine with any of the following medications:   gatifloxacin  This medicine may also interact with the following medications:   alcohol   certain medicines for blood pressure, heart disease   digoxin   diuretics   insulin   nateglinide   phenobarbital   phenytoin   repaglinide   rifampin   ritonavir   sulfonylureas like glimepiride, glipizide, glyburide  What if I miss a dose?  If you miss a dose, take it as soon as you can. If it is almost time for your next dose, take only that dose. Do not take double or extra doses.  Where should I keep my medicine?  Keep out of the reach of children.  Store at room temperature between 20 and 25 degrees C (68 and 77 degrees F).  Throw away any unused medicine after the expiration date.  What should I  tell my health care provider before I take this medicine?  They need to know if you have any of these conditions:   artery disease   dehydration   diabetic ketoacidosis   diet low in salt   eating less due to illness, surgery, dieting, or any other reason   foot sores   having surgery   high cholesterol   high levels of potassium in the blood   history of amputation   history of pancreatitis or pancreas problems   history of yeast infection of the penis or vagina   if you often drink alcohol   infections in the bladder, kidneys, or urinary tract   kidney disease   liver disease   low blood pressure   nerve damage   on hemodialysis   problems urinating   type 1 diabetes   uncircumcised male   an unusual or allergic reaction to canagliflozin, other medicines, foods, dyes, or preservatives   pregnant or trying to get pregnant   breast-feeding  What should I watch for while using this medicine?  Visit your doctor or health care professional for regular checks on your progress.  This medicine can cause a serious condition in which there is too much acid in the blood. If you develop nausea, vomiting, stomach pain, unusual tiredness, or breathing problems, stop taking this medicine and call your doctor right away. If possible, use a ketone dipstick to check for ketones in your urine.  A test called the HbA1C (A1C) will be monitored. This is a simple blood test. It measures your blood sugar control over the last 2 to 3 months. You will receive this test every 3 to 6 months.  Learn how to check your blood sugar. Learn the symptoms of low and high blood sugar and how to manage them.  Always carry a quick-source of sugar with you in case you have symptoms of low blood sugar. Examples include hard sugar candy or glucose tablets. Make sure others know that you can choke if you eat or drink when you develop serious symptoms of low blood sugar, such as seizures or unconsciousness. They must get medical  help at once.  Tell your doctor or health care professional if you have high blood sugar. You might need to change the dose of your medicine. If you are sick or exercising more than usual, you might need to change the dose of your medicine.  Do not skip meals. Ask your doctor or health care professional if you should avoid alcohol. Many nonprescription cough and cold products contain sugar or alcohol. These can affect blood sugar.  Wear a medical ID bracelet or chain, and carry a card that describes your disease and details of your medicine and dosage times.  NOTE:This sheet is a summary. It may not cover all possible information. If you have questions about this medicine, talk to your doctor, pharmacist, or health care provider. Copyright 2020 [employer]

## 2019-06-28 NOTE — Progress Notes (Signed)
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 it's easier to prevent problems than to treat them later on. So see your healthcare team for regular checkups and foot care. Your healthcare team can also help you learn more about caring for your feet at home. For example, you may be told to not walk barefoot, even in your home. Or you may be told you need special footwear to protect your feet.  Have regular checkups  Foot problems can happen quickly. So follow your  healthcare team's schedule for regular checkups. During office visits, take off your shoes and socks as soon as you get in the exam room. Ask your healthcare provider to check your feet for problems. This will make it easier to find and treat small skin issues before they get worse. Regular checkups can also help keep track of the blood flow and feeling in your feet. You may have lack of feeling in your feet (neuropathy). Then you will need checkups more often.  Learn about self-care  The more you know about diabetes and your feet, the easier it will be to prevent problems. Your healthcare team can teach you how to check your feet. And teach you to look for warning signs. They can also give you other foot care tips. Before your office visits, write down any foot care questions you have. During office visits, ask any  questions you have.  [employer] last reviewed this educational content on 11/13/2017   2000-2020 [employer]. [address] [place] [address]. All rights reserved. This information is not intended as a substitute for professional medical care. Always follow your healthcare professional's instructions.           Patient Education     Canagliflozin oral tablets  Brand Name: Anastasio Auerbach  What is this medicine?  CANAGLIFLOZIN (KAN a gli FLOE zin) helps to treat type 2 diabetes. It helps to control blood sugar. Treatment is combined with diet and exercise. This drug may also reduce the risk of heart attack, stroke, or death if you have type 2 diabetes and risk factors for heart disease. If you have diabetic kidney disease with a certain amount of protein in the urine, this drug may reduce your risk of end stage kidney disease (ESKD), worsened kidney function, and hospitalization for heart failure.  How should I use this medicine?  Take this medicine by mouth with a glass of water. Follow the directions on the prescription label. Take it before the first meal of the day. Take your dose at the same time each day. Do not take more often than directed. Do not stop taking except on your doctor's advice.  A special MedGuide will be given to you by the pharmacist with each prescription and refill. Be sure to read this information carefully each time.  Talk to your pediatrician regarding the use of this medicine in children. Special care may be needed.  What side effects may I notice from receiving this medicine?  Side effects that you should report to your doctor or health care professional as soon as possible:   allergic reactions like skin rash, itching or hives, swelling of the face, lips, or tongue   breathing problems   chest pain   dizziness   feeling faint or lightheaded, falls   muscle weakness   nausea, vomiting, unusual stomach upset or pain   new pain or tenderness, change in skin color,  sores or ulcers, or infection in legs or feet   penile discharge, itching, or pain in men   signs and symptoms of a genital infection, such as fever; tenderness, redness, or swelling in the genitals or area from the genitals to the back of the rectum   signs and symptoms of low blood sugar such as feeling anxious, confusion, dizziness, increased hunger, unusually weak or tired, sweating, shakiness, cold, irritable, headache, blurred vision, fast heartbeat, loss of consciousness   signs and symptoms of a urinary tract infection, such as fever, chills, a burning  feeling when urinating, blood in the urine, back pain   tingling or numbness in the hands, legs, or feet   trouble passing urine or change in the amount of urine, including an urgent need to urinate more often, in larger amounts, or at night   unusual tiredness   vaginal discharge, itching, or odor in women  Side effects that usually do not require medical attention (report to your doctor or health care professional if they continue or are bothersome):   mild increase in urination   thirsty  What may interact with this medicine?  Do not take this medicine with any of the following medications:   gatifloxacin  This medicine may also interact with the following medications:   alcohol   certain medicines for blood pressure, heart disease   digoxin   diuretics   insulin   nateglinide   phenobarbital   phenytoin   repaglinide   rifampin   ritonavir   sulfonylureas like glimepiride, glipizide, glyburide  What if I miss a dose?  If you miss a dose, take it as soon as you can. If it is almost time for your next dose, take only that dose. Do not take double or extra doses.  Where should I keep my medicine?  Keep out of the reach of children.  Store at room temperature between 20 and 25 degrees C (68 and 77 degrees F). Throw away any unused medicine after the expiration date.  What should I tell my health care provider before I take this medicine?   They need to know if you have any of these conditions:   artery disease   dehydration   diabetic ketoacidosis   diet low in salt   eating less due to illness, surgery, dieting, or any other reason   foot sores   having surgery   high cholesterol   high levels of potassium in the blood   history of amputation   history of pancreatitis or pancreas problems   history of yeast infection of the penis or vagina   if you often drink alcohol   infections in the bladder, kidneys, or urinary tract   kidney disease   liver disease   low blood pressure   nerve damage   on hemodialysis   problems urinating   type 1 diabetes   uncircumcised male   an unusual or allergic reaction to canagliflozin, other medicines, foods, dyes, or preservatives   pregnant or trying to get pregnant   breast-feeding  What should I watch for while using this medicine?  Visit your doctor or health care professional for regular checks on your progress.  This medicine can cause a serious condition in which there is too much acid in the blood. If you develop nausea, vomiting, stomach pain, unusual tiredness, or breathing problems, stop taking this medicine and call your doctor right away. If possible, use a ketone dipstick to check for ketones in your urine.  A test called the HbA1C (A1C) will be monitored. This is a simple blood test. It measures your blood sugar control over the last 2 to 3 months. You will receive this test every 3 to 6 months.  Learn how to check your blood sugar. Learn the symptoms of low and high blood sugar and how to manage them.  Always carry a quick-source of sugar with you in case you have symptoms of low blood sugar. Examples include hard sugar candy or glucose tablets. Make sure others know that you can  choke if you eat or drink when you develop serious symptoms of low blood sugar, such as seizures or unconsciousness. They must get medical help at once.  Tell your doctor or health care professional  if you have high blood sugar. You might need to change the dose of your medicine. If you are sick or exercising more than usual, you might need to change the dose of your medicine.  Do not skip meals. Ask your doctor or health care professional if you should avoid alcohol. Many nonprescription cough and cold products contain sugar or alcohol. These can affect blood sugar.  Wear a medical ID bracelet or chain, and carry a card that describes your disease and details of your medicine and dosage times.  NOTE:This sheet is a summary. It may not cover all possible information. If you have questions about this medicine, talk to your doctor, pharmacist, or health care provider. Copyright [facility]  Physician: Ashok Pall Wilhemena Durie, MD  Address: [facility]   [address] [place]  [address]   Phone: [phone]

## 2019-06-29 ENCOUNTER — Encounter (INDEPENDENT_AMBULATORY_CARE_PROVIDER_SITE_OTHER): Payer: Self-pay | Admitting: Family Medicine

## 2019-10-04 ENCOUNTER — Telehealth (INDEPENDENT_AMBULATORY_CARE_PROVIDER_SITE_OTHER): Payer: Self-pay | Admitting: Family Medicine

## 2019-10-04 NOTE — Telephone Encounter (Signed)
ROI to   [facility]   [address]   [place], [place] [address]  Ph: [phone]  Fax: [phone]

## 2019-11-18 ENCOUNTER — Other Ambulatory Visit (INDEPENDENT_AMBULATORY_CARE_PROVIDER_SITE_OTHER): Payer: Self-pay | Admitting: Family Medicine

## 2019-11-18 DIAGNOSIS — E119 Type 2 diabetes mellitus without complications: Secondary | ICD-10-CM

## 2019-11-19 NOTE — Telephone Encounter (Signed)
Ask: Parthenia Ames ARNP Fax: [phone]  [facility]   [address]   [place], [place] [address]  Ph: [phone]  Fax: [phone]

## 2021-05-01 IMAGING — MR MRI CERVICAL SPINE W/WO CONTRAST
8 of 10 series · 28 of 48 positions shown · IV contrast (gadavist)
Comparison: Cervical spine MRI 09/27/2020

________________________________________________________________________________________________ 
MRI CERVICAL SPINE W/WO CONTRAST, 05/01/2021 [DATE]: 
CLINICAL INDICATION: 71-year-old male with known cervical spine lesion on prior 
MRI. Numbness and tingling in the left arm and hand for several months.
TECHNIQUE: Multiplanar, multiecho position MR images of the cervical spine were 
performed without and with 9 mL of Gadavist were injected intravenously by hand. 
1 mL of Gadavist were discarded. Patient was scanned on a 3T magnet.

[Series 1001: survey · axial · 10.0mm · 0.94mm/px · 1 of 9 slices shown]
[im 1/9]
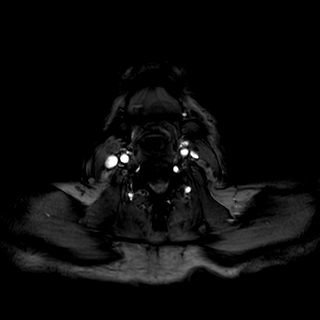

[Series 1101: t2w_tse cor · coronal · 5.0mm · 0.52mm/px · 2 of 7 slices shown]
[im 1/7]
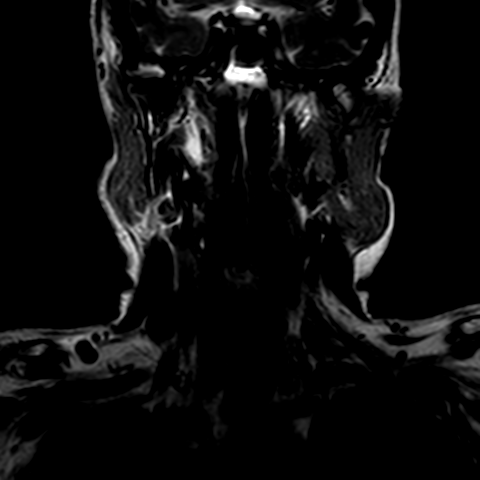
[im 7/7]
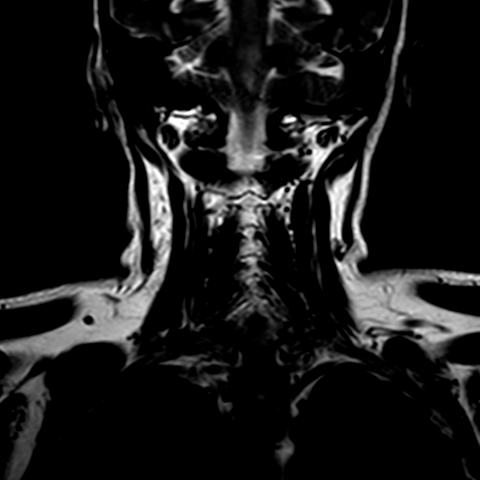

[Series 1201: t1w_tse sag · sagittal · 3.0mm · 0.50mm/px · 4 of 15 slices shown]
[im 1/15]
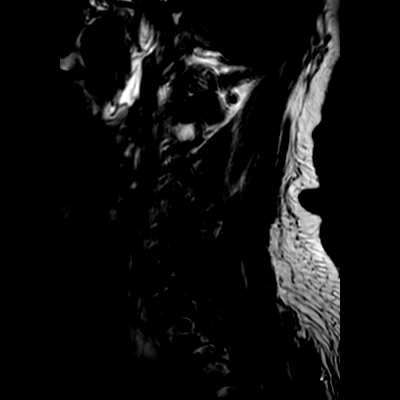
[im 5/15]
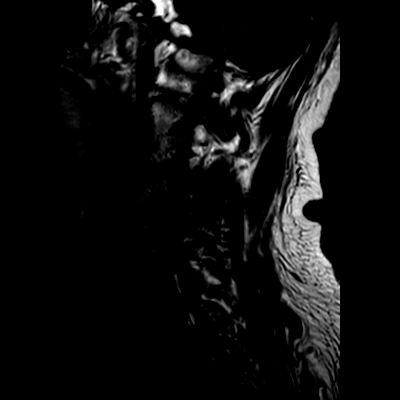
[im 10/15]
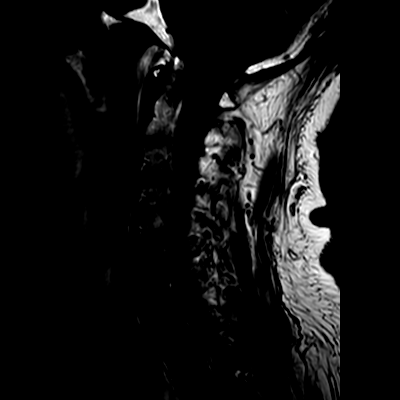
[im 15/15]
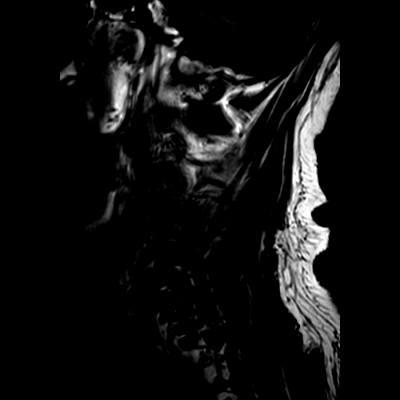

[Series 1301: t2w_tse sag · sagittal · 3.0mm · 0.50mm/px · 4 of 15 slices shown]
[im 1/15]
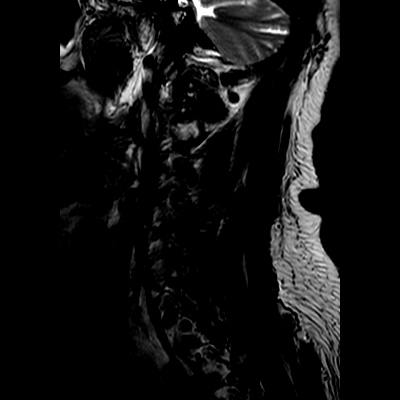
[im 5/15]
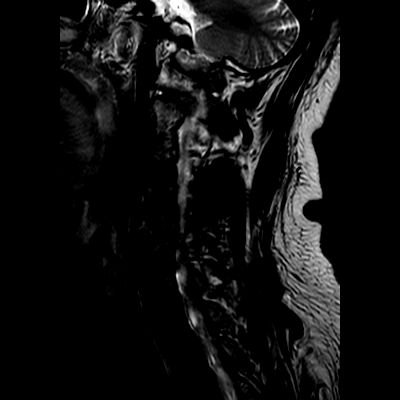
[im 10/15]
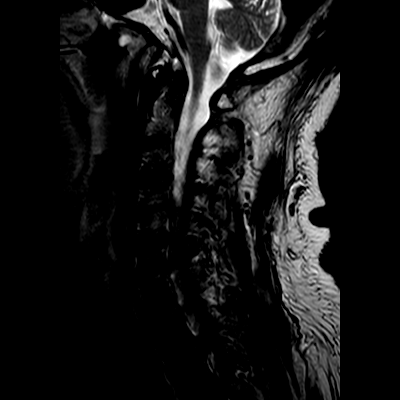
[im 15/15]
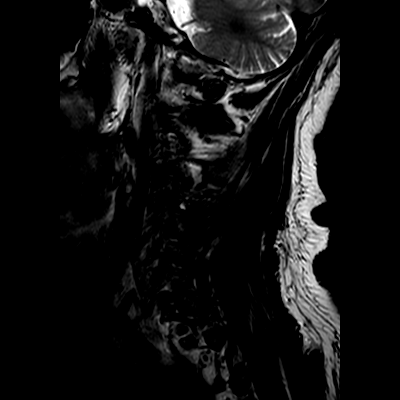

[Series 1401: stir_longte sag · sagittal · 3.0mm · 0.62mm/px · 4 of 15 slices shown]
[im 1/15]
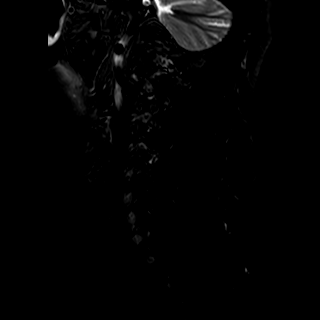
[im 5/15]
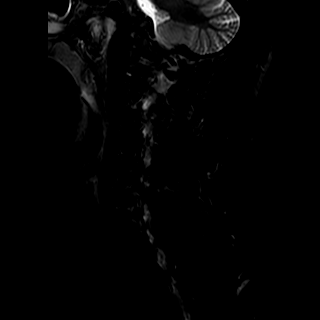
[im 10/15]
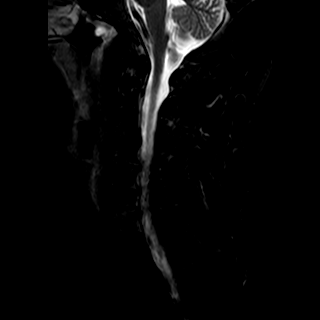
[im 15/15]
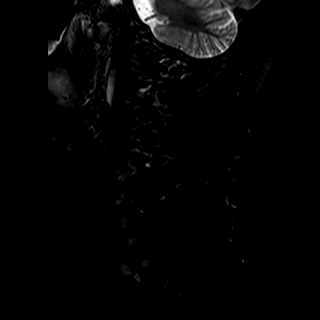

[Series 1501: t2w_tse_ax · axial · 3.0mm · 0.36mm/px · z∈[-247,-115]mm · 8 of 45 slices shown]
[im 1/45]
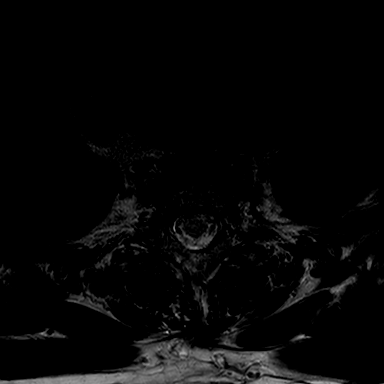
[im 9/45]
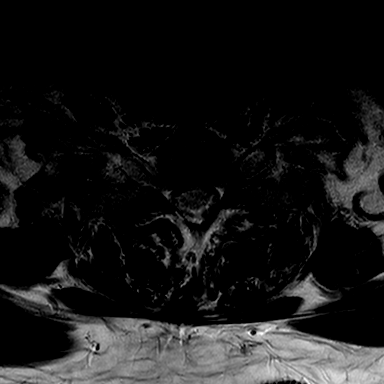
[im 13/45]
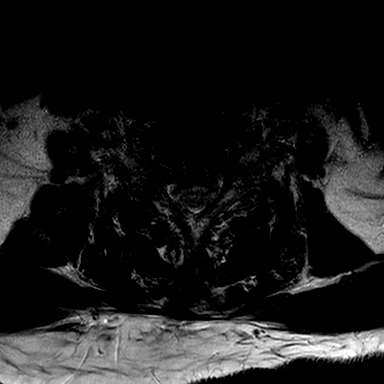
[im 21/45]
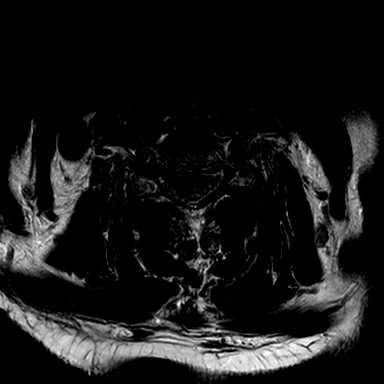
[im 25/45]
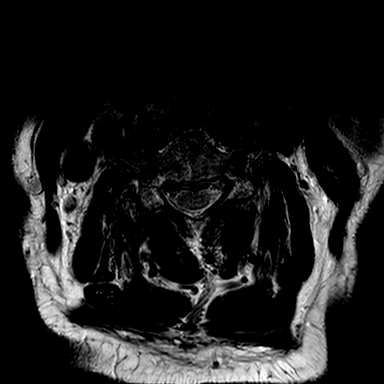
[im 33/45]
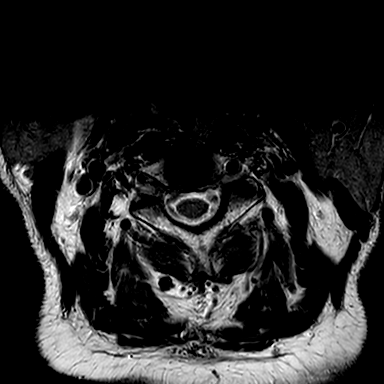
[im 37/45]
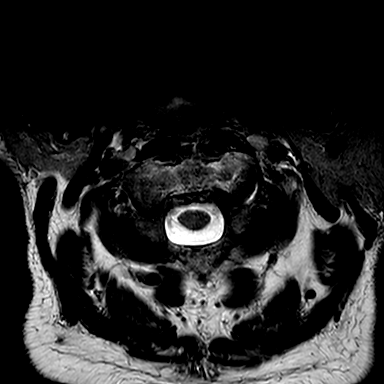
[im 45/45]
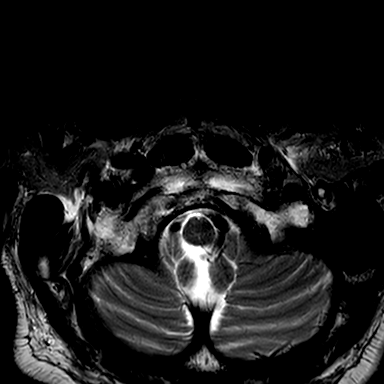

[Series 1601: gad t1w_tse sag · sagittal · 3.0mm · 0.52mm/px · 1 of 15 slices shown]
[im 1/15]
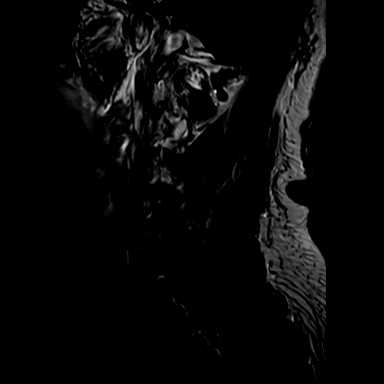

[Series 1701: T1 fat-sat post-contrast · sagittal · 3.0mm · 0.52mm/px · 4 of 15 slices shown]
[im 1/15]
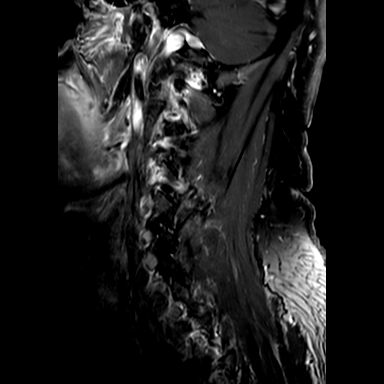
[im 5/15]
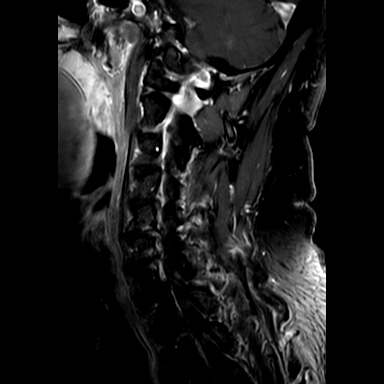
[im 10/15]
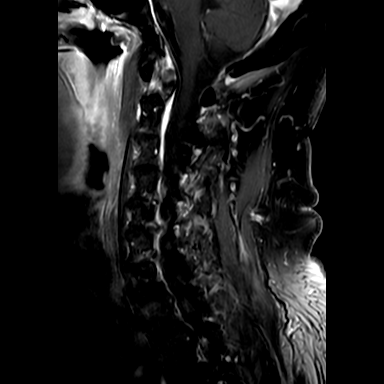
[im 15/15]
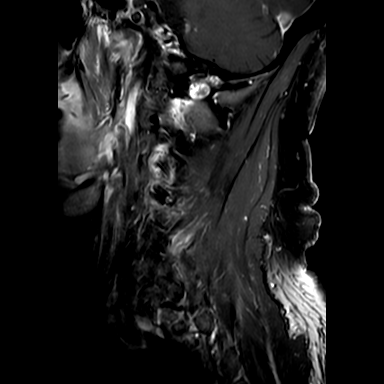

[28 of 48 positions shown; findings below may reference images not displayed]

FINDINGS: -------------------------------------------------------------------------------- 
----------------- 
GENERAL: Motion artifact degrades several of the sequences, making evaluation of 
the previously noted cervical spine signal changes difficult. The previously 
noted region of signal change within the cervical cord is again subtly 
identified. It appears slightly less conspicuous in size. There is clear, 
definite evidence of abnormal cord signal at the C5-C6 level. This extends over 
a craniocaudal length of 1.7 cm and measures a greatest axial dimension of 2 mm 
anteroposterior by 6 mm transverse. The cord does not appear expanded at this 
segment and may in fact be somewhat atrophic in this region. The signal 
abnormality appears most conspicuous in the region of the gray matter, as seen 
on axial T2-weighted sequences. There is no enhancement of this finding. There 
is associated canal stenosis in this region which are detailed below. Anatomic 
sagittal and coronal alignment. Vertebral body height preserved. No evidence of 
a marrow infiltrative process. No marrow or ligamentous edema. No other abnormal 
cord signal. No abnormal enhancement. Tornwaldt cyst in the nasopharynx. 
Otherwise the prevertebral soft tissues are unremarkable. Paraspinal soft 
tissues unremarkable. 
-------------------------------------------------------------------------------- 
---------------- 
SEGMENTAL: 
Craniocervical junction: No significant canal stenosis. 
C2-C3: Loss of disc signal. The canal and foramina are patent. Mild facet 
arthropathy. Findings stable. 
C3-C4: Loss of disc signal. Canal is minimally narrowed. Mild right foraminal 
narrowing. Left foramen is patent. Facet arthropathy. Findings appear stable. 
C4-C5: Mild loss of disc height with slight loss of disc signal. Generalized 
disc osteophyte complex with posterior ligamentous hypertrophy creates mild to 
moderate canal stenosis. The canal measures 6 mm in AP dimension. Moderate 
bilateral foraminal narrowing. Facet arthropathy. Findings stable. 
C5-C6: Moderate loss of disc height with loss of disc signal. There is 
generalized disc osteophyte complex which abuts and slightly flattens the 
ventral cord margin with posterior ligamentous hypertrophy. Cord signal 
abnormality is present at this level. Severe bilateral foraminal narrowing. 
There is moderate canal stenosis at this level. Canal measures 7 mm in AP 
dimension. Finding stable. 
C6-C7: Moderate loss of disc height. Loss of disc signal. Generalized disc 
osteophyte complex abuts the ventral cord margin with mild to moderate canal 
stenosis. Mild right and moderate left foraminal narrowing. Facet arthropathy. 
Findings stable. 
C7-T1: Loss of disc signal. Canal and foramina are patent. Mild facet 
arthropathy. Findings stable. 
The visualized upper thoracic segments are unremarkable. 
-------------------------------------------------------------------------------- 
---------------
IMPRESSION: Motion artifact degrades several of the sequences making evaluation of the 
previously noted abnormal cord signal difficult. However, there is clear 
evidence of abnormal cord signal at the C5-C6 level. This appears slightly less 
conspicuous than on the previous study and there appears to be perhaps mild cord 
atrophy in this region. There is no abnormal associated enhancement. The lack of 
progression and todays findings is suggestive of cord myelomalacia and may be 
compressive in etiology. Regardless, given the suboptimal imaging today, a 
short-term follow-up study in 3-4 months, without and with gadolinium is 
recommended. Could consider sedation at the time of that study to avoid patient 
motion. 
Multilevel cervical degenerative changes are otherwise stable. Most significant 
findings consist of canal and foraminal stenosis from C4-C5 through C6-C7 
levels.

## 2021-05-01 IMAGING — MR MRI BRAIN W/WO CONTRAST
5 of 12 series · 20 of 48 positions shown · IV contrast (gadavist)
Comparison: None.

________________________________________________________________________________________________ 
MRI BRAIN W/WO CONTRAST, 05/01/2021 [DATE]: 
CLINICAL INDICATION: Numbness in the arms. Numbness and tingling mostly on left 
arm.
TECHNIQUE: Multiplanar, multiecho position MR images of the brain were performed 
without and with 9 mL of Gadavist were injected intravenously by hand. 1 mL of 
Gadavist were discarded.  Patient was scanned on a 3T magnet.

[Series 501: FLAIR · axial · 5.0mm · 0.60mm/px · z∈[-109,+44]mm · 3 of 27 slices shown]
[im 1/27]
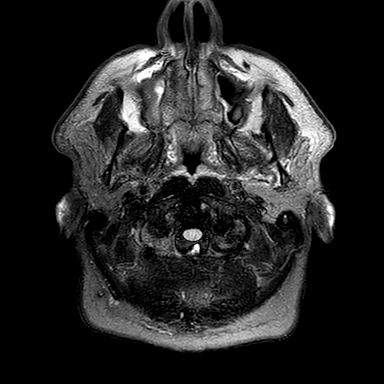
[im 14/27]
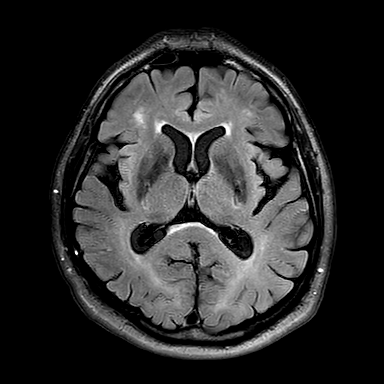
[im 27/27]
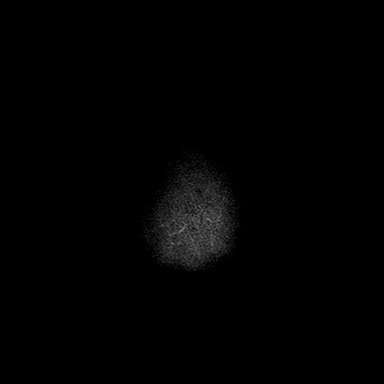

[Series 701: SWI · axial · 3.0mm · 0.53mm/px · z∈[-106,+11]mm · 7 of 100 slices shown]
[im 1/100]
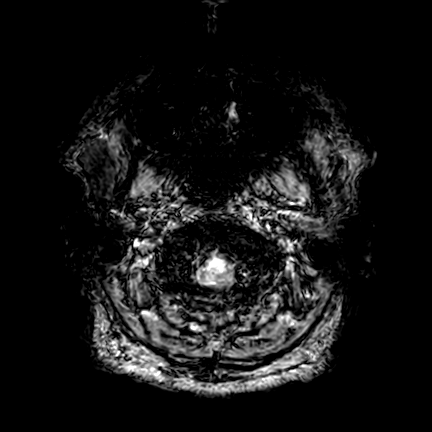
[im 20/100]
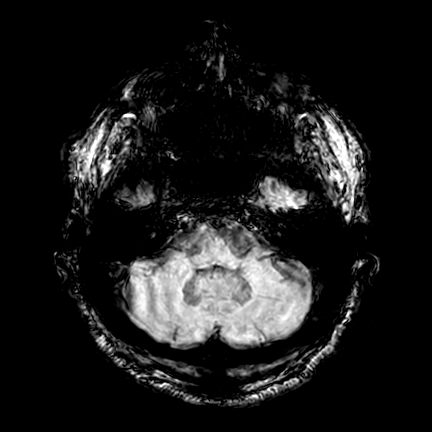
[im 30/100]
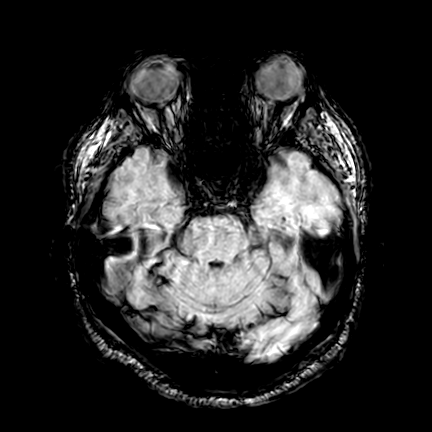
[im 40/100]
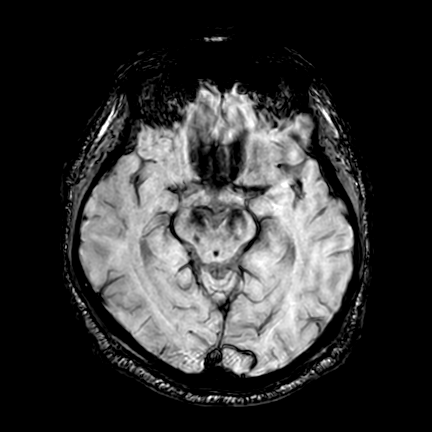
[im 60/100]
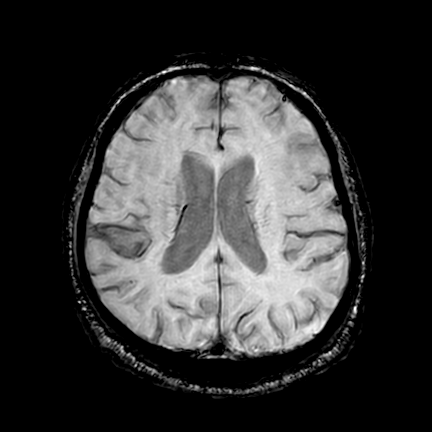
[im 70/100]
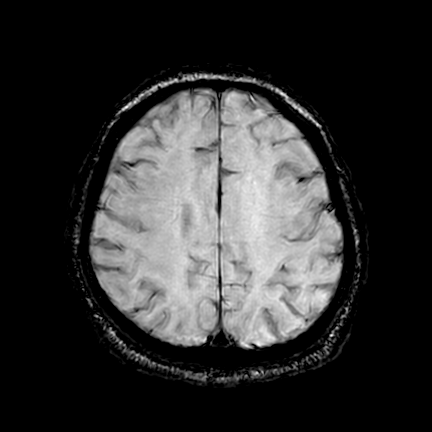
[im 80/100]
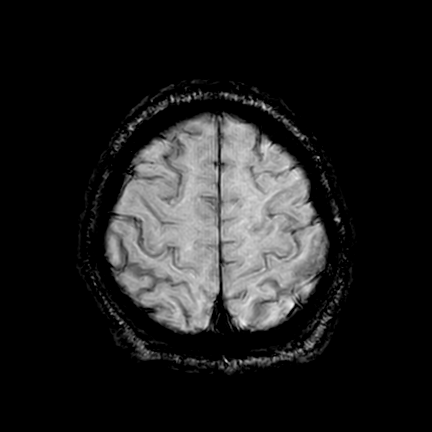

[Series 801: T2 · axial · 5.0mm · 0.41mm/px · z∈[-109,+44]mm · 3 of 27 slices shown]
[im 1/27]
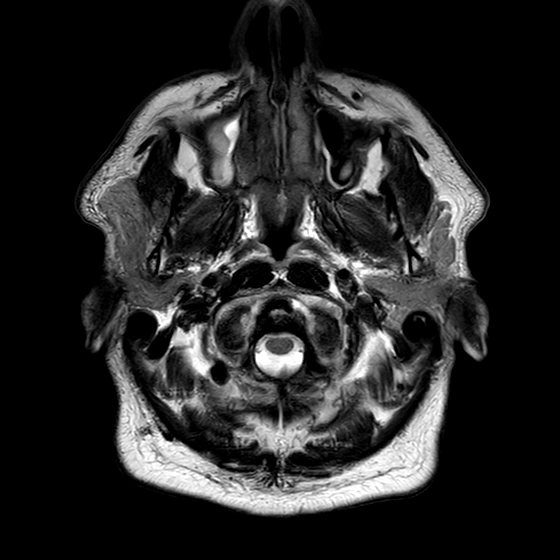
[im 14/27]
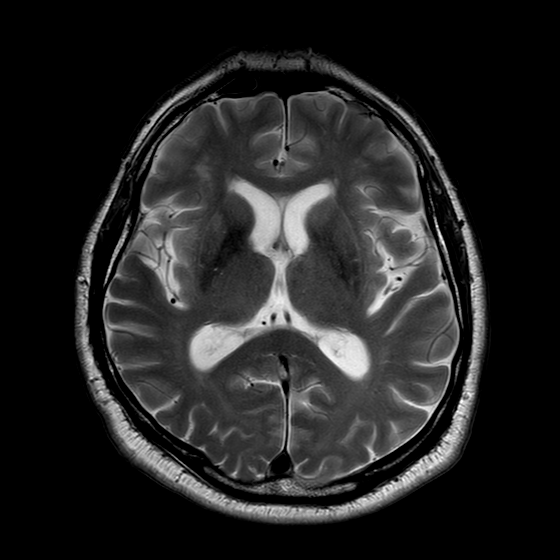
[im 27/27]
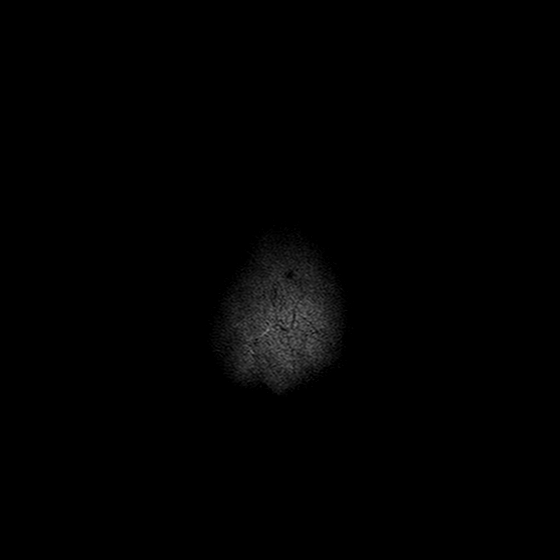

[Series 2001: T1 post-contrast · coronal · 4.0mm · 0.38mm/px · 4 of 36 slices shown (1 of 2)]
[im 1/36]
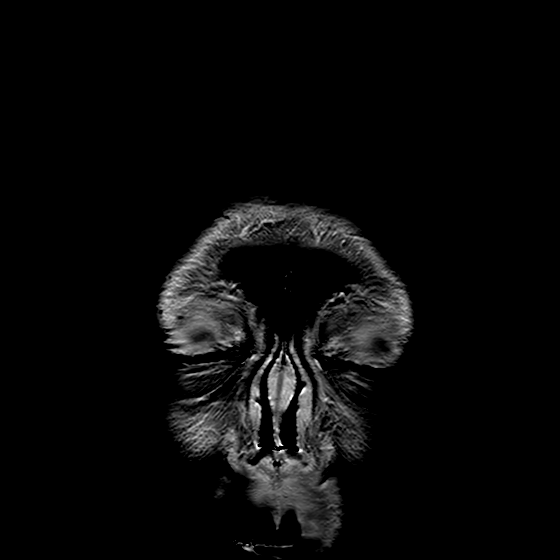
[im 12/36]
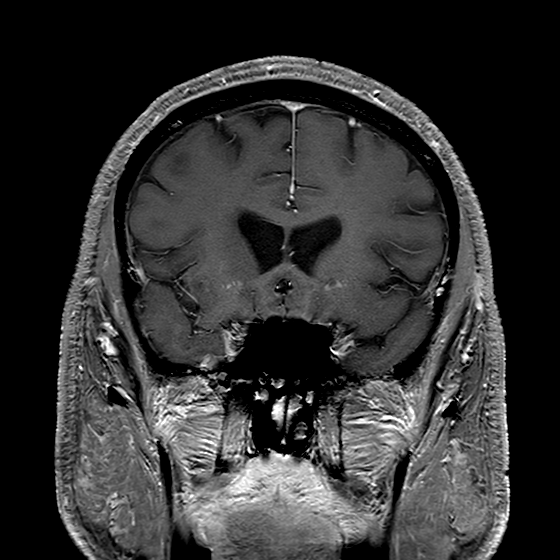
[im 24/36]
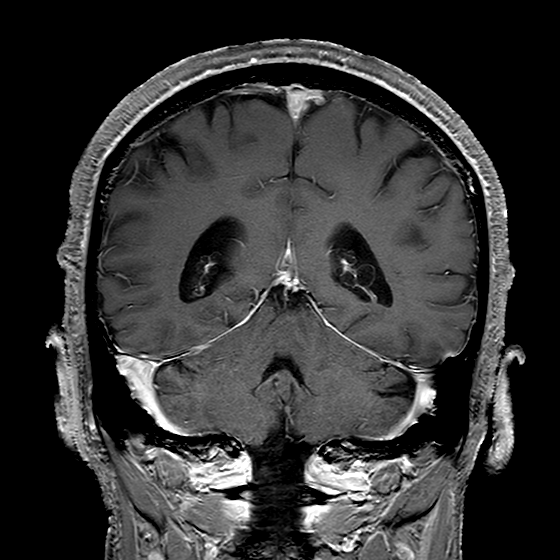
[im 36/36]
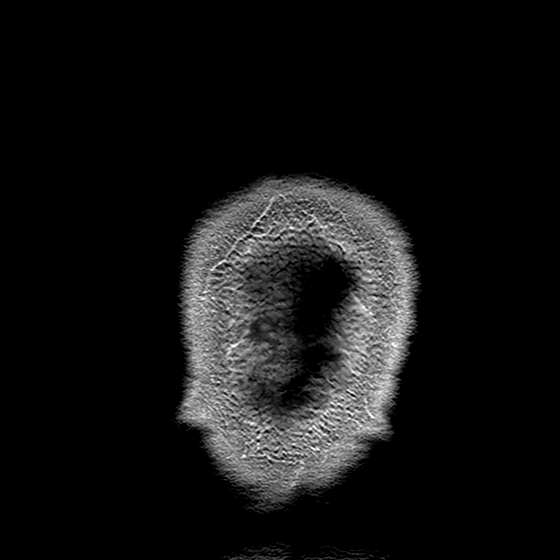

[Series 2101: T1 post-contrast · sagittal · 4.0mm · 0.34mm/px · 3 of 32 slices shown (2 of 2)]
[im 1/32]
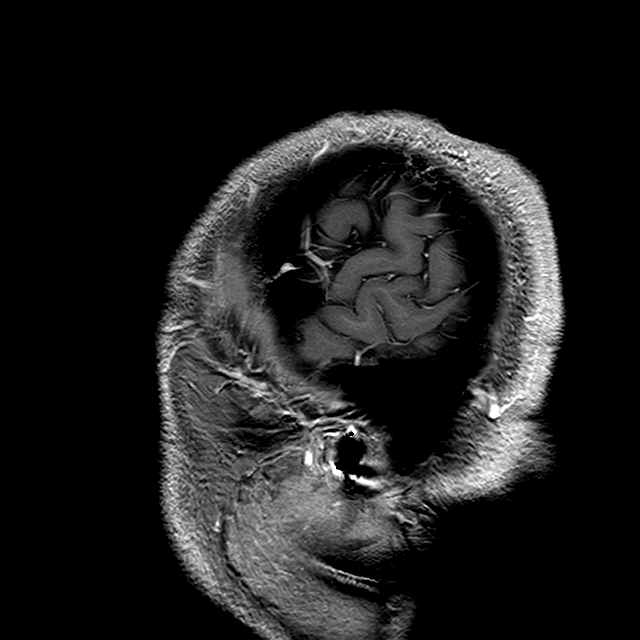
[im 16/32]
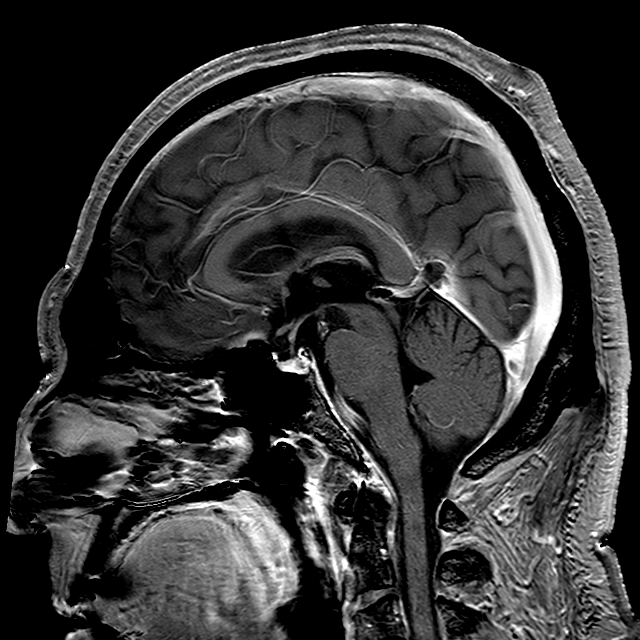
[im 32/32]
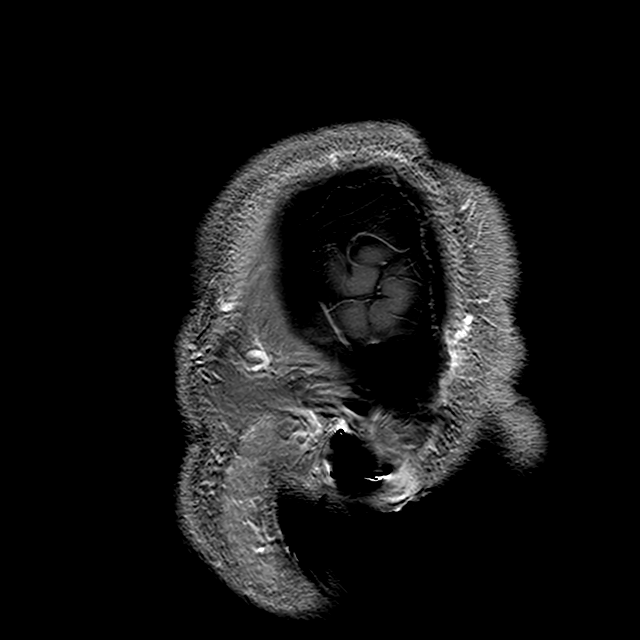

[20 of 48 positions shown; findings below may reference images not displayed]

FINDINGS: -------------------------------------------------------------------------------- 
------------------------- 
INTRACRANIAL: 
No acute ischemia. No abnormal foci of susceptibility artifact in the brain. 
Punctate focus of susceptibility artifact along the right posterior periatrial 
white matter. Periventricular and deep white matter change, probably secondary 
to microangiopathy. Patency of the intracranial vascular flow voids.  No acute 
intracranial hemorrhage, mass effect, midline shift. No large sellar mass. No 
hydrocephalus. Cerebral volume is age appropriate.  No pathologic contrast 
enhancement.  
-------------------------------------------------------------------------------- 
----------------------- 
OTHER: 
ORBITS/SINUSES/T-BONES:  Visualized orbits show no acute abnormality or mass.  
Mastoid air cells and middle ear cavities are grossly clear.  Mild mucosal 
changes in the maxillary sinuses. 
MARROW SIGNAL/SOFT TISSUES: No focal suspect signal abnormality. 
-------------------------------------------------------------------------------- 
-------------------
IMPRESSION: 1.  No acute intracranial abnormality or pathologic enhancement.   
2.  White matter microangiopathic changes. 
3.  Right posterior periatrial white matter cavernoma versus chronic 
hypertensive microhemorrhage.
